# Patient Record
Sex: Female | Born: 1980
Health system: Southern US, Community
[De-identification: ages and names within clinical notes are randomized; demographics above are authoritative.]

## PROBLEM LIST (undated history)

## (undated) DIAGNOSIS — J309 Allergic rhinitis, unspecified: Secondary | ICD-10-CM

## (undated) DIAGNOSIS — R87619 Unspecified abnormal cytological findings in specimens from cervix uteri: Secondary | ICD-10-CM

## (undated) DIAGNOSIS — M25561 Pain in right knee: Secondary | ICD-10-CM

## (undated) DIAGNOSIS — A312 Disseminated mycobacterium avium-intracellulare complex (DMAC): Secondary | ICD-10-CM

## (undated) DIAGNOSIS — I10 Essential (primary) hypertension: Secondary | ICD-10-CM

## (undated) DIAGNOSIS — B20 Human immunodeficiency virus [HIV] disease: Secondary | ICD-10-CM

## (undated) DIAGNOSIS — M25562 Pain in left knee: Secondary | ICD-10-CM

## (undated) DIAGNOSIS — J302 Other seasonal allergic rhinitis: Secondary | ICD-10-CM

## (undated) HISTORY — DX: Disseminated mycobacterium avium-intracellulare complex (DMAC): A31.2

## (undated) HISTORY — DX: Morbid (severe) obesity due to excess calories: E66.01

## (undated) HISTORY — DX: Allergic rhinitis, unspecified: J30.9

## (undated) HISTORY — DX: Pain in left knee: M25.562

## (undated) HISTORY — DX: Human immunodeficiency virus (HIV) disease: B20

## (undated) HISTORY — DX: Unspecified abnormal cytological findings in specimens from cervix uteri: R87.619

## (undated) HISTORY — DX: Other seasonal allergic rhinitis: J30.2

## (undated) HISTORY — DX: Pain in right knee: M25.561

---
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
## 2009-09-01 DIAGNOSIS — A312 Disseminated mycobacterium avium-intracellulare complex (DMAC): Secondary | ICD-10-CM

## 2009-09-01 HISTORY — DX: Disseminated mycobacterium avium-intracellulare complex (DMAC): A31.2

## 2009-11-05 DIAGNOSIS — R87619 Unspecified abnormal cytological findings in specimens from cervix uteri: Secondary | ICD-10-CM

## 2009-11-05 HISTORY — DX: Unspecified abnormal cytological findings in specimens from cervix uteri: R87.619

## 2016-10-02 ENCOUNTER — Encounter: Payer: Self-pay | Admitting: Infectious Diseases

## 2016-10-02 ENCOUNTER — Other Ambulatory Visit: Payer: Self-pay | Admitting: Infectious Diseases

## 2016-10-03 ENCOUNTER — Encounter: Payer: Self-pay | Admitting: Infectious Diseases

## 2016-10-03 ENCOUNTER — Ambulatory Visit (INDEPENDENT_AMBULATORY_CARE_PROVIDER_SITE_OTHER): Payer: Self-pay | Admitting: Infectious Diseases

## 2016-10-03 VITALS — BP 140/94 | HR 96 | Temp 97.7°F | Wt >= 6400 oz

## 2016-10-03 DIAGNOSIS — B20 Human immunodeficiency virus [HIV] disease: Secondary | ICD-10-CM | POA: Insufficient documentation

## 2016-10-03 DIAGNOSIS — E669 Obesity, unspecified: Secondary | ICD-10-CM

## 2016-10-03 DIAGNOSIS — Z87898 Personal history of other specified conditions: Secondary | ICD-10-CM

## 2016-10-03 DIAGNOSIS — B370 Candidal stomatitis: Secondary | ICD-10-CM | POA: Insufficient documentation

## 2016-10-03 DIAGNOSIS — J302 Other seasonal allergic rhinitis: Secondary | ICD-10-CM

## 2016-10-03 DIAGNOSIS — Z6841 Body Mass Index (BMI) 40.0 and over, adult: Secondary | ICD-10-CM

## 2016-10-03 DIAGNOSIS — IMO0001 Reserved for inherently not codable concepts without codable children: Secondary | ICD-10-CM

## 2016-10-03 DIAGNOSIS — Z113 Encounter for screening for infections with a predominantly sexual mode of transmission: Secondary | ICD-10-CM

## 2016-10-03 DIAGNOSIS — Z8742 Personal history of other diseases of the female genital tract: Secondary | ICD-10-CM

## 2016-10-03 HISTORY — DX: Other seasonal allergic rhinitis: J30.2

## 2016-10-03 LAB — CBC WITH DIFFERENTIAL/PLATELET
Basophils Absolute: 0 cells/uL (ref 0–200)
Basophils Relative: 0 %
EOS ABS: 104 {cells}/uL (ref 15–500)
Eosinophils Relative: 4 %
HEMATOCRIT: 39.4 % (ref 35.0–45.0)
HEMOGLOBIN: 12.5 g/dL (ref 11.7–15.5)
LYMPHS ABS: 806 {cells}/uL — AB (ref 850–3900)
LYMPHS PCT: 31 %
MCH: 26.2 pg — ABNORMAL LOW (ref 27.0–33.0)
MCHC: 31.7 g/dL — AB (ref 32.0–36.0)
MCV: 82.4 fL (ref 80.0–100.0)
MONO ABS: 260 {cells}/uL (ref 200–950)
MPV: 9.3 fL (ref 7.5–12.5)
Monocytes Relative: 10 %
NEUTROS PCT: 55 %
Neutro Abs: 1430 cells/uL — ABNORMAL LOW (ref 1500–7800)
Platelets: 226 10*3/uL (ref 140–400)
RBC: 4.78 MIL/uL (ref 3.80–5.10)
RDW: 17.4 % — AB (ref 11.0–15.0)
WBC: 2.6 10*3/uL — ABNORMAL LOW (ref 3.8–10.8)

## 2016-10-03 MED ORDER — EMTRICITABINE-TENOFOVIR AF 200-25 MG PO TABS: 1.0000 | ORAL_TABLET | Freq: Every day | ORAL | 5 refills | Status: DC

## 2016-10-03 MED ORDER — FLUCONAZOLE 200 MG PO TABS: 200.0000 mg | ORAL_TABLET | Freq: Every day | ORAL | 0 refills | Status: DC

## 2016-10-03 MED ORDER — AZITHROMYCIN 600 MG PO TABS: 1200.0000 mg | ORAL_TABLET | ORAL | 0 refills | Status: DC

## 2016-10-03 MED ORDER — AZITHROMYCIN 600 MG PO TABS: 1200.0000 mg | ORAL_TABLET | ORAL | 11 refills | Status: DC

## 2016-10-03 MED ORDER — DARUNAVIR-COBICISTAT 800-150 MG PO TABS: 1.0000 | ORAL_TABLET | Freq: Every day | ORAL | 5 refills | Status: DC

## 2016-10-03 MED ORDER — DAPSONE 100 MG PO TABS: 100.0000 mg | ORAL_TABLET | Freq: Every day | ORAL | 0 refills | Status: DC

## 2016-10-03 MED ORDER — DAPSONE 100 MG PO TABS: 100.0000 mg | ORAL_TABLET | Freq: Every day | ORAL | 5 refills | Status: DC

## 2016-10-03 MED FILL — AZITHROMYCIN 600 MG TABLET: 600 | 28 days supply | Qty: 8 | Fill #0

## 2016-10-03 MED FILL — FLUCONAZOLE 200 MG TABLET: 200 | 10 days supply | Qty: 10 | Fill #0

## 2016-10-03 MED FILL — DAPSONE 100 MG TABLET: 100 | 30 days supply | Qty: 30 | Fill #0

## 2016-10-03 NOTE — Assessment & Plan Note (Signed)
Nasal congestion worsened since moving to [place]. Previously on Loratadine without remedy. Advised to try OTC Zyrtec once a day. Cautioned against Flonase with Prezcobix and advised to stick to saline rinses for nasal congestion at this point.

## 2016-10-03 NOTE — Assessment & Plan Note (Addendum)
Will start Descovy/Prezcobix today and await resistance testing as she is treatment experienced. With oral thrush and history of MAC wll rx Dapsone and Azithromycin for OI proph. She is having regular unprotected sex with her fiance Sara Osborne - counseled on HIV transmission and safe sex practices. Will check the following labs: CBC w/ Diff, CMET, HgbA1C, HIV VL with genotype reflex including INSTI resistance, CD4 count, HLA-B*701, Hep A Ab, HepB sAg, HepB sAb, HepC Ab, quantiferon gold, urinalysis, urine GC/C, PAP smear obtained today with HPV testing, cervical GC/C, trichomoniasis and pregnancy test as she has had unprotected sex and still menstruating regularly. She will return to meet with our pharmacy team in a few weeks and with myself in 6 - 8 weeks with labs the week before.   Discussed during our encounter PrEP - partner testing negative today for rapid HIV. He has a visit scheduled with pharmacy team for follow up care.

## 2016-10-03 NOTE — Progress Notes (Addendum)
HPI: Sara Osborne is a 36 y.o. female who presents to the [facility] clinic to initiate care for her HIV infection with our nurse practitioner Judeth CornfieldStephanie. She was diagnosed with HIV in 1999.   Allergies: Allergies  Allergen Reactions  . Bactrim [Sulfamethoxazole-Trimethoprim] Rash    Fever  . Sulfa Antibiotics Rash    Past Medical History: Past Medical History:  Diagnosis Date  . Abnormal Pap smear of cervix 11/05/2009   LGSIL/ASCUS with colposcopy/biopsy 12/2009  . Allergic rhinitis   . 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 Completed tx.   Marland Kitchen. HIV (human immunodeficiency virus infection) (HCC) 1999   Found during pregnancy; CD4 at time of Dx 14  . HIV (human immunodeficiency virus infection) (HCC) 10/03/2016  . Morbid obesity (HCC)    514 lb 2014  . Obesity 10/03/2016  . Seasonal allergies 10/03/2016    Social History: Social History   Social History  . Marital status: Divorced    Spouse name: N/A  . Number of children: 2  . Years of education: 15   Occupational History  . unemployed     Social History Main Topics  . Smoking status: Former Smoker    Types: Cigarettes    Quit date: 08/04/2015  . Smokeless tobacco: Never Used  . Alcohol use No  . Drug use: No  . Sexual activity: Yes    Partners: Male    Birth control/ protection: None   Other Topics Concern  . None   Social History Narrative  . None    Current Regimen: None right now  Labs: No results found for: HIV1RNAQUANT, HIV1RNAVL, CD4TABS, HEPBSAB, HEPBSAG, HCVAB  CrCl: CrCl cannot be calculated (No order found.).  Lipids: No results found for: CHOL, TRIG, HDL, CHOLHDL, VLDL, LDLCALC  Assessment: Sara Osborne is here today to initiate care in our clinic for her HIV infection.  She has a long history of HIV, diagnosed in 1999.  She was initially on Combivir + Viracept when diagnosed and then eventually transitioned to Atripla.  She has  had several lapses in therapy due to moving to different states throughout her life.  She has not been on medications for her HIV since 2014. She tells me she was pretty good about taking her medications when she had them and was engaged in care.  Due to her spotty adherence and lapses in taking medications for her HIV infection, we will start her on something with a high barrier to resistance such as Prezcobix + Descovy until we get resistance labs back.  She has no insurance and applied for ADAP with Olegario MessierKathy.  It will be a few weeks until she is approved.  I will call her/reach out to her once she gets approved and bring her back in ~1 month later for adherence and labs. I also spoke to her fiance, Dushun, about PrEP.  They routinely have unprotected sex.   Plans: - Prezcobix + Descovy once ADAP approved - Labs today per Judeth CornfieldStephanie - Will schedule follow-up once she starts medications  Cassie L. Kuppelweiser, PharmD, CPP Infectious Diseases Clinical Pharmacist [facility] 10/03/2016, 3:55 PM    atripla combivr + viracept AZT  -- dx in 1999  ADAP.

## 2016-10-03 NOTE — Assessment & Plan Note (Signed)
Encouraged good food choices. Asked to attempt to decrease sugar/processed foods from diet and start incorporating small changes for exercise at home. Limited by joint pain/back pain. BP slightly above goal today - will reassess and follow. May need BP medication if she cannot lose the weight. Hgb A1C obtained today. Will check fasting lipids with next lab draw.

## 2016-10-03 NOTE — Patient Instructions (Addendum)
We are going to start you on DESCOVY + PREZCOBIX today - these are very important to take at the same time a day every day TOGETHER with food.   We will check lab work today and let you know of results. Instructions sent for MyChart so you can see your lab results.   For your stuffy nose - can try Zyrtec - D or Claritin - D over the counter daily to help if allergy related. Also continue to use your saline rinse.   Tylenol for ear pain  Back to see pharmacy team in 2-3 weeks and with Judeth CornfieldStephanie again in 8 weeks to recheck your labs.

## 2016-10-03 NOTE — Progress Notes (Signed)
Brief Narrative: Sara Osborne is a new patient to our clinic for HIV care. She recently relocated from [place] with [facility]. Originally diagnosed with HIV in 1999 during pregnancy. History of OIs: disseminated MAC  PCP - Patient, No Pcp Per    Patient Active Problem List   Diagnosis Date Noted  . HIV (human immunodeficiency virus infection) ([place]) 10/03/2016  . Thrush 10/03/2016  . Obesity 10/03/2016  . Seasonal allergies 10/03/2016    Patient's Medications  New Prescriptions   AZITHROMYCIN (ZITHROMAX) 600 MG TABLET    Take 2 tablets (1,200 mg total) by mouth once a week.   DAPSONE 100 MG TABLET    Take 1 tablet (100 mg total) by mouth daily.   DARUNAVIR-COBICISTAT (PREZCOBIX) 800-150 MG TABLET    Take 1 tablet by mouth daily. Swallow whole. Do NOT crush, break or chew tablets. Take with food.   EMTRICITABINE-TENOFOVIR AF (DESCOVY) 200-25 MG TABLET    Take 1 tablet by mouth daily.   FLUCONAZOLE (DIFLUCAN) 200 MG TABLET    Take 1 tablet (200 mg total) by mouth daily.  Previous Medications   No medications on file  Modified Medications   No medications on file  Discontinued Medications   No medications on file    Subjective: Sara Osborne presents to clinic today for her HIV infection.   HPI:  HIV =  She was originally diagnosed with HIV in 1999 during her first pregnancy. In the past she was maintained on Combivir/Viracept in early 2000s and was doing well with tx adherence until 2005 when she moved to [place] and was lost to care until 2011 when she returned to care in [place]. At that time she was found to have disseminated MAC 09/2009 on AFB BCx - she was asymptomatic at the time. Was treated with ethambutol 1600 mg daily + Azithromycin 600 mg daily + Rifabutin 450 mg daily x 12 months until 08/31/2010 when she was transitioned to MAC proph with weekly azithromycin + daily ethambutol. Was taking Atripla since 12/2009 until 2014. She has not been seen for  her HIV in ~3 years.    Since that time she Symptoms associated with her HIV today include recurrent thrush and genital ulcers. Last viral load and CD4 counts 07/2011 - CD4 315 (14 at initial diagnosis), VL - 41   Abnormal Pap Smears = I have seen both LGSIL and ASCUS documented in her chart from 2011 samples - reports having had a biopsy in 12/2009. Last pap done 2011.   Health Maintenance = ongoing struggle with weight gain. She has gained 40 lbs since 2014 documentation.    I have reviewed all available clinical documents sent from [facility] from 2013 - 2014.   Review of Systems: Review of Systems  Constitutional: Negative for chills, fever, malaise/fatigue and weight loss.  HENT: Positive for congestion and ear pain. Negative for ear discharge, hearing loss, sinus pain and sore throat.   Eyes: Negative for blurred vision.  Respiratory: Positive for wheezing. Negative for cough, sputum production and shortness of breath.   Cardiovascular: Negative for chest pain and orthopnea.  Gastrointestinal: Negative for abdominal pain, diarrhea, nausea and vomiting.  Genitourinary: Negative for dysuria, frequency and urgency.  Musculoskeletal: Positive for back pain and joint pain. Negative for myalgias.  Skin: Negative for rash.  Neurological: Negative for dizziness and headaches.  Endo/Heme/Allergies: Negative for polydipsia.  Psychiatric/Behavioral: Negative for depression and substance abuse. The patient is not  nervous/anxious.     Past Medical History:  Diagnosis Date  . Abnormal Pap smear of cervix 11/05/2009   LGSIL/ASCUS with colposcopy/biopsy 12/2009  . Allergic rhinitis   . Disseminated mycobacterium avium-intracellulare complex ([place]) 09/2009   Dx via BCx - asymptomatic at the time. Completed tx.   Marland Kitchen HIV (human immunodeficiency virus infection) ([place]) 1999   Found during pregnancy; CD4 at time of Dx 14  . HIV (human immunodeficiency virus infection) ([place]) 10/03/2016  .  Morbid obesity ([place])    514 lb 2014  . Obesity 10/03/2016  . Seasonal allergies 10/03/2016    Social History  Substance Use Topics  . Smoking status: Former Smoker    Types: Cigarettes    Quit date: 08/04/2015  . Smokeless tobacco: Never Used  . Alcohol use No    Family History  Problem Relation Age of Onset  . Hypertension Mother   . Hypertension Father   . Heart attack Father     Allergies  Allergen Reactions  . Bactrim [Sulfamethoxazole-Trimethoprim] Rash    Fever  . Sulfa Antibiotics Rash    Objective:  Vitals:   10/03/16 0921  BP: (!) 140/94  Pulse: 96  Temp: 97.7 F (36.5 C)  TempSrc: Oral  Weight: (!) 539 lb (244.5 kg)   There is no height or weight on file to calculate BMI.  Physical Exam  Constitutional: She is oriented to person, place, and time and well-developed, well-nourished, and in no distress.  HENT:  Right Ear: Tympanic membrane, external ear and ear canal normal.  Left Ear: External ear and ear canal normal. A middle ear effusion is present.  Mouth/Throat: No oral lesions. Abnormal dentition. Dental caries present. No dental abscesses.  Oral thrush present   Cardiovascular: Normal rate, regular rhythm and normal heart sounds.   Pulmonary/Chest: Effort normal and breath sounds normal.  Abdominal: Soft. She exhibits no distension. There is no tenderness.  Genitourinary: Cervix normal. Vulva exhibits no erythema, no lesion and no rash. Thin  odorless  white and vaginal discharge found.  Musculoskeletal: Normal range of motion. She exhibits no tenderness.  Lymphadenopathy:    She has no cervical adenopathy.    She has no axillary adenopathy.  Neurological: She is alert and oriented to person, place, and time.  Skin: Skin is warm and dry. No rash noted.  Psychiatric: Mood, affect and judgment normal.    Lab Results  Reviewed from clinical docs 08/2010 - CD4 - 123 VL - 110 copies  G6PD - normal  HAV - immune HBV - immune   Problem List Items  Addressed This Visit      Respiratory   Seasonal allergies    Nasal congestion worsened since moving to [place]. Previously on Loratadine without remedy. Advised to try OTC Zyrtec once a day. Cautioned against Flonase with Prezcobix and advised to stick to saline rinses for nasal congestion at this point.         Digestive   Thrush   Relevant Medications   emtricitabine-tenofovir AF (DESCOVY) 200-25 MG tablet   darunavir-cobicistat (PREZCOBIX) 800-150 MG tablet   azithromycin (ZITHROMAX) 600 MG tablet   dapsone 100 MG tablet   fluconazole (DIFLUCAN) 200 MG tablet     Other   Obesity    Encouraged good food choices. Asked to attempt to decrease sugar/processed foods from diet and start incorporating small changes for exercise at home. Limited by joint pain/back pain. BP slightly above goal today - will reassess and follow. May need BP medication  if she cannot lose the weight. Hgb A1C obtained today. Will check fasting lipids with next lab draw.       Relevant Orders   Lipid panel   HIV (human immunodeficiency virus infection) ([place]) (Chronic)    Will start Descovy/Prezcobix today and await resistance testing as she is treatment experienced. With oral thrush and history of MAC wll rx Dapsone and Azithromycin for OI proph. She is having regular unprotected sex with her fiance Raquel Sarna - counseled on HIV transmission and safe sex practices. Will check the following labs: CBC w/ Diff, CMET, HgbA1C, HIV VL with genotype reflex including INSTI resistance, CD4 count, HLA-B*701, Hep A Ab, HepB sAg, HepB sAb, HepC Ab, quantiferon gold, urinalysis, urine GC/C, PAP smear obtained today with HPV testing, cervical GC/C, trichomoniasis and pregnancy test as she has had unprotected sex and still menstruating regularly. She will return to meet with our pharmacy team in a few weeks and with myself in 6 - 8 weeks with labs the week before.   Discussed during our encounter PrEP - partner testing negative today  for rapid HIV. He has a visit scheduled with pharmacy team for follow up care.        Relevant Medications   emtricitabine-tenofovir AF (DESCOVY) 200-25 MG tablet   darunavir-cobicistat (PREZCOBIX) 800-150 MG tablet   azithromycin (ZITHROMAX) 600 MG tablet   dapsone 100 MG tablet   fluconazole (DIFLUCAN) 200 MG tablet   Other Relevant Orders   HIV RNA, RTPCR W/R GT (RTI, PI,INT)   T-helper cell (CD4)- ([facility] clinic only)   RPR   COMPLETE METABOLIC PANEL WITH GFR   CBC with Differential/Platelet   HLA B*5701   Quantiferon tb gold assay (blood)   Hemoglobin A1c   hCG, quantitative, pregnancy   Hepatitis B surface antigen   Hepatitis B surface antibody   Hepatitis A antibody, total   Urinalysis   Urine cytology ancillary only   HIV 1 RNA quant-no reflex-bld   Lipid panel    Other Visit Diagnoses    History of abnormal cervical Pap smear    -  Primary   Relevant Orders   Cytology - PAP   History of AIDS (HCC)       Relevant Medications   azithromycin (ZITHROMAX) 600 MG tablet   dapsone 100 MG tablet   Screening examination for venereal disease       Relevant Orders   Urine cytology ancillary only     Health maintenance = PAP smear obtained today in office. Will await results and refer for GYN services if indicated.   Smoking cessation = recently quit smoking within the last 12 months. Celebrated and offered continued support through counseling services here should they be needed.   I spent greater than 60 minutes with the patient including greater than 50% of time in face to face counsel of the patient re HIV care, HIV prevention / PrEP for her partner, pap smear procedure and in coordination of their care.  Janene Madeira, MSN, NP-C [facility] [place] Cell: [phone] Pager: [phone]  10/03/16 12:10 PM

## 2016-10-04 LAB — HEPATITIS B SURFACE ANTIGEN: Hepatitis B Surface Ag: NEGATIVE

## 2016-10-04 LAB — HCG, QUANTITATIVE, PREGNANCY: hCG, Beta Chain, Quant, S: <2 m[IU]/mL

## 2016-10-04 LAB — URINALYSIS
BILIRUBIN URINE: NEGATIVE
GLUCOSE, UA: NEGATIVE
Hgb urine dipstick: NEGATIVE
Ketones, ur: NEGATIVE
LEUKOCYTES UA: NEGATIVE
Nitrite: NEGATIVE
SPECIFIC GRAVITY, URINE: 1.027 (ref 1.001–1.035)
pH: 6 (ref 5.0–8.0)

## 2016-10-04 LAB — HEPATITIS B SURFACE ANTIBODY,QUALITATIVE: HEP B S AB: POSITIVE — AB

## 2016-10-04 LAB — COMPLETE METABOLIC PANEL WITH GFR
ALT: 17 U/L (ref 6–29)
AST: 24 U/L (ref 10–30)
Albumin: 3.8 g/dL (ref 3.6–5.1)
Alkaline Phosphatase: 95 U/L (ref 33–115)
BUN: 13 mg/dL (ref 7–25)
CHLORIDE: 104 mmol/L (ref 98–110)
CO2: 22 mmol/L (ref 20–31)
CREATININE: 0.69 mg/dL (ref 0.50–1.10)
Calcium: 8.4 mg/dL — ABNORMAL LOW (ref 8.6–10.2)
GFR, Est African American: >89 mL/min (ref >=60)
GFR, Est Non African American: >89 mL/min (ref >=60)
GLUCOSE: 99 mg/dL (ref 65–99)
Potassium: 4.1 mmol/L (ref 3.5–5.3)
SODIUM: 139 mmol/L (ref 135–146)
Total Bilirubin: 0.3 mg/dL (ref 0.2–1.2)
Total Protein: 7.7 g/dL (ref 6.1–8.1)

## 2016-10-04 LAB — HEMOGLOBIN A1C
HEMOGLOBIN A1C: 5.8 % — AB (ref <5.7)
MEAN PLASMA GLUCOSE: 120 mg/dL

## 2016-10-04 LAB — HEPATITIS A ANTIBODY, TOTAL: Hep A Total Ab: NONREACTIVE

## 2016-10-05 ENCOUNTER — Telehealth: Payer: Self-pay | Admitting: Infectious Diseases

## 2016-10-05 DIAGNOSIS — B20 Human immunodeficiency virus [HIV] disease: Secondary | ICD-10-CM

## 2016-10-05 LAB — T-HELPER CELL (CD4) - (RCID CLINIC ONLY): CD4 % Helper T Cell: <1 % — ABNORMAL LOW (ref 33–55)

## 2016-10-05 LAB — CYTOLOGY - PAP
ADEQUACY: ABSENT
Chlamydia: NEGATIVE
Diagnosis: NEGATIVE
HPV (WINDOPATH): NOT DETECTED
NEISSERIA GONORRHEA: NEGATIVE
Trichomonas: NEGATIVE

## 2016-10-05 LAB — QUANTIFERON TB GOLD ASSAY (BLOOD)
Interferon Gamma Release Assay: NEGATIVE
Mitogen-Nil: 6.43 IU/mL
QUANTIFERON TB AG MINUS NIL: 0 [IU]/mL
Quantiferon Nil Value: 0.17 IU/mL

## 2016-10-05 LAB — URINE CYTOLOGY ANCILLARY ONLY
Chlamydia: NEGATIVE
NEISSERIA GONORRHEA: NEGATIVE

## 2016-10-05 NOTE — Telephone Encounter (Addendum)
Called to discuss CD4 results with Sara Osborne - again verified she has no fevers/chills, weight loss, diarrhea/abdominal pain, headaches, vision changes, or lymphadenopathy. Reports her ADAP was just approved and she will get her Descovy/Prezcobix from [place] soon. She has picked up her Azithromycin, Dapsone and Fluconazole and started taking these medications.   Will have her come Monday AM for lab work to check AFB blood cx since she has had asymptomatic disseminated MAC in the past after several years of being off ART.

## 2016-10-05 NOTE — Progress Notes (Signed)
Patient CD4 count extremely low - I have already placed her on OI proph at her recent OV with dapsone / azithromycin with her history and presence of oral thrush.

## 2016-10-05 NOTE — Progress Notes (Signed)
Cervical cytology normal without HPV detected, although no EC/TZ cells present as collection was difficult due to body habitus. Per ASCCP and HIV guidelines will continue with routine annual screening, however will consider re-testing in 6 months considering her history and uncontrolled HIV.

## 2016-10-06 ENCOUNTER — Encounter: Payer: Self-pay | Admitting: Infectious Diseases

## 2016-10-06 LAB — RPR

## 2016-10-06 NOTE — Telephone Encounter (Signed)
Lab appt made for 10/10/16 in the AM, no transportation in PM.

## 2016-10-07 LAB — HLA B*5701: HLA-B*5701 w/rflx HLA-B High: NEGATIVE

## 2016-10-10 ENCOUNTER — Other Ambulatory Visit: Payer: Self-pay

## 2016-10-10 LAB — HIV RNA, RTPCR W/R GT (RTI, PI,INT)
HIV-1 RNA, QN PCR: 5.42 {Log_copies}/mL — AB
HIV-1 RNA, QN PCR: 264000 copies/mL — ABNORMAL HIGH

## 2016-10-12 ENCOUNTER — Other Ambulatory Visit: Payer: Self-pay

## 2016-10-16 ENCOUNTER — Encounter: Payer: Self-pay | Admitting: Infectious Diseases

## 2016-10-16 ENCOUNTER — Telehealth: Payer: Self-pay | Admitting: *Deleted

## 2016-10-16 DIAGNOSIS — Z9119 Patient's noncompliance with other medical treatment and regimen: Secondary | ICD-10-CM

## 2016-10-16 DIAGNOSIS — Z91199 Patient's noncompliance with other medical treatment and regimen due to unspecified reason: Secondary | ICD-10-CM

## 2016-10-16 LAB — RFLX HIV-1 INTEGRASE GENOTYPE

## 2016-10-16 NOTE — Telephone Encounter (Signed)
I have sent her a MyChart message as well to schedule an appointment. Please send in referral for Ambre to assist with her care. She needs OV with me or pharmacy in 2 weeks.   Thank you

## 2016-10-16 NOTE — Telephone Encounter (Signed)
[facility] to see if patient was in care, asked for records. Sent as requested. Per chart, [facility]. Andree CossHowell, Bushra Denman M, RN

## 2016-10-16 NOTE — Addendum Note (Signed)
Addended by: Andree CossHOWELL, MICHELLE M on: 10/16/2016 04:17 PM   Modules accepted: Orders

## 2016-10-16 NOTE — Telephone Encounter (Signed)
Order written. Thanks! 

## 2016-10-17 LAB — HIV-1 GENOTYPE: HIV-1 Genotype: DETECTED — AB

## 2016-10-20 ENCOUNTER — Telehealth: Payer: Self-pay | Admitting: *Deleted

## 2016-10-20 NOTE — Telephone Encounter (Signed)
Referral received and I noted from prior documentation that the patient is on vacation so email was sent stating  "Sara EchevariaHey Sara Osborne!! This is Sara Osborne. I was not sure if you were back from [place] and just wanted to say Hello! Looking forward to meeting you.  Just give me a call, text or email with you get back and try to stay out of the HEAT.  Lind CovertAmbre Tyreisha Ungar [phone] [name]"

## 2016-10-23 ENCOUNTER — Other Ambulatory Visit: Payer: Self-pay

## 2016-10-23 DIAGNOSIS — B20 Human immunodeficiency virus [HIV] disease: Secondary | ICD-10-CM

## 2016-10-23 DIAGNOSIS — IMO0001 Reserved for inherently not codable concepts without codable children: Secondary | ICD-10-CM

## 2016-10-24 ENCOUNTER — Telehealth: Payer: Self-pay | Admitting: *Deleted

## 2016-10-24 LAB — LIPID PANEL
CHOLESTEROL: 180 mg/dL (ref <200)
HDL: 56 mg/dL (ref >50)
LDL Cholesterol: 111 mg/dL — ABNORMAL HIGH (ref <100)
TRIGLYCERIDES: 66 mg/dL (ref <150)
Total CHOL/HDL Ratio: 3.2 Ratio (ref <5.0)
VLDL: 13 mg/dL (ref <30)

## 2016-10-24 NOTE — Telephone Encounter (Signed)
Called patient today RN explained my role and offered services at this time. 1st contact made via email on the 20th. She has picked up all of her medications and states getting the medications from [facility] is not a problem. At this time she is not ready for a home visit. RN offered just calls, text messages for support without home visits but patient is unable to consent to care at this time. I did not want to be too aggressive. Visit with Sara CornfieldStephanie scheduled for August 6th and RN will just meet the patient at that time.

## 2016-10-25 LAB — HIV-1 RNA QUANT-NO REFLEX-BLD
HIV 1 RNA QUANT: 3680 {copies}/mL — AB
HIV-1 RNA Quant, Log: 3.57 Log copies/mL — ABNORMAL HIGH

## 2016-10-26 ENCOUNTER — Encounter: Payer: Self-pay | Admitting: Infectious Diseases

## 2016-11-03 ENCOUNTER — Encounter: Payer: Self-pay | Admitting: Licensed Clinical Social Worker

## 2016-11-06 ENCOUNTER — Ambulatory Visit
Admission: RE | Admit: 2016-11-06 | Discharge: 2016-11-06 | Disposition: A | Discharge status: Home / Self Care | Payer: Self-pay | Source: Ambulatory Visit | Attending: Infectious Diseases | Admitting: Infectious Diseases

## 2016-11-06 ENCOUNTER — Other Ambulatory Visit: Payer: Self-pay | Admitting: Infectious Diseases

## 2016-11-06 ENCOUNTER — Ambulatory Visit: Payer: Self-pay | Admitting: *Deleted

## 2016-11-06 ENCOUNTER — Ambulatory Visit (INDEPENDENT_AMBULATORY_CARE_PROVIDER_SITE_OTHER): Payer: Self-pay | Admitting: Infectious Diseases

## 2016-11-06 ENCOUNTER — Encounter: Payer: Self-pay | Admitting: Infectious Diseases

## 2016-11-06 VITALS — BP 129/85 | Temp 97.7°F | Wt >= 6400 oz

## 2016-11-06 DIAGNOSIS — M25561 Pain in right knee: Secondary | ICD-10-CM

## 2016-11-06 DIAGNOSIS — M25562 Pain in left knee: Secondary | ICD-10-CM

## 2016-11-06 DIAGNOSIS — B20 Human immunodeficiency virus [HIV] disease: Secondary | ICD-10-CM

## 2016-11-06 DIAGNOSIS — Z21 Asymptomatic human immunodeficiency virus [HIV] infection status: Secondary | ICD-10-CM

## 2016-11-06 DIAGNOSIS — Z0000 Encounter for general adult medical examination without abnormal findings: Secondary | ICD-10-CM

## 2016-11-06 DIAGNOSIS — H539 Unspecified visual disturbance: Secondary | ICD-10-CM

## 2016-11-06 DIAGNOSIS — Z113 Encounter for screening for infections with a predominantly sexual mode of transmission: Secondary | ICD-10-CM | POA: Insufficient documentation

## 2016-11-06 HISTORY — DX: Pain in left knee: M25.562

## 2016-11-06 HISTORY — DX: Pain in right knee: M25.561

## 2016-11-06 NOTE — Assessment & Plan Note (Addendum)
I have referred her to internal medicine for primary care management. She is in need of opthalmology referral because her visual acuity has changed, however has no insurance. Discussed [facility] role in helping obtain glasses. Will also provide resources to obtain [employer] as she may have some orthopedic knee pending this xray of her knee. No vaccine needs today. Needs to lose weight to help overall CV risk reduction, diabetes risk (she is pre-diabetic by A1C, which may be under-estimated with her HIV) and now knee pain.

## 2016-11-06 NOTE — Patient Instructions (Addendum)
We will refer you to primary care for [facility] Card to help get your eyes evaluated.   Continue to take your Prezcobix and Descovy with food every day. We will check your labs today.   ICE ICE ICE for your knees in addition to over the counter tylenol and ibuprofen. Please try to take tylenol as first option.     Plantar Fasciitis Plantar fasciitis is a painful foot condition that affects the heel. It occurs when the band of tissue that connects the toes to the heel bone (plantar fascia) becomes irritated. This can happen after exercising too much or doing other repetitive activities (overuse injury). The pain from plantar fasciitis can range from mild irritation to severe pain that makes it difficult for you to walk or move. The pain is usually worse in the morning or after you have been sitting or lying down for a while. What are the causes? This condition may be caused by:  Standing for long periods of time.  Wearing shoes that do not fit.  Doing high-impact activities, including running, aerobics, and ballet.  Being overweight.  Having an abnormal way of walking (gait).  Having tight calf muscles.  Having high arches in your feet.  Starting a new athletic activity.  What are the signs or symptoms? The main symptom of this condition is heel pain. Other symptoms include:  Pain that gets worse after activity or exercise.  Pain that is worse in the morning or after resting.  Pain that goes away after you walk for a few minutes.  How is this diagnosed? This condition may be diagnosed based on your signs and symptoms. Your health care provider will also do a physical exam to check for:  A tender area on the bottom of your foot.  A high arch in your foot.  Pain when you move your foot.  Difficulty moving your foot.  You may also need to have imaging studies to confirm the diagnosis. These can include:  X-rays.  Ultrasound.  MRI.  How is this treated? Treatment for  plantar fasciitis depends on the severity of the condition. Your treatment may include:  Rest, ice, and over-the-counter pain medicines to manage your pain.  Exercises to stretch your calves and your plantar fascia.  A splint that holds your foot in a stretched, upward position while you sleep (night splint).  Physical therapy to relieve symptoms and prevent problems in the future.  Cortisone injections to relieve severe pain.  Extracorporeal shock wave therapy (ESWT) to stimulate damaged plantar fascia with electrical impulses. It is often used as a last resort before surgery.  Surgery, if other treatments have not worked after 12 months.  Follow these instructions at home:  Take medicines only as directed by your health care provider.  Avoid activities that cause pain.  Roll the bottom of your foot over a bag of ice or a bottle of cold water. Do this for 20 minutes, 3-4 times a day.  Perform simple stretches as directed by your health care provider.  Try wearing athletic shoes with air-sole or gel-sole cushions or soft shoe inserts.  Wear a night splint while sleeping, if directed by your health care provider.  Keep all follow-up appointments with your health care provider. How is this prevented?  Do not perform exercises or activities that cause heel pain.  Consider finding low-impact activities if you continue to have problems.  Lose weight if you need to. The best way to prevent plantar fasciitis is to  avoid the activities that aggravate your plantar fascia. Contact a health care provider if:  Your symptoms do not go away after treatment with home care measures.  Your pain gets worse.  Your pain affects your ability to move or do your daily activities. This information is not intended to replace advice given to you by your health care provider. Make sure you discuss any questions you have with your health care provider. Document Released: 12/13/2000 Document  Revised: 08/23/2015 Document Reviewed: 01/28/2014 Elsevier Interactive Patient Education  [employer]2018 Elsevier Inc.

## 2016-11-06 NOTE — Assessment & Plan Note (Addendum)
Doing well on DESCOVY / PREZCOBIX aside from what I think are headaches associated with medication use. No resistance detected aside from K103n NNRTI that was likely transmitted. I hope they will resolve with some time however I advised her to continue to track. Oral thrush resolved on mediations. Continue dapsone and azithromycin with history of disseminated MAC and very low CD4 count. VL and CD4 to be checked again today. She has had excellent viral reduction sofar. She will return to see me again in 2 months. Her husband is being seen in our clinic for PrEP.

## 2016-11-06 NOTE — Progress Notes (Signed)
PCP - Mower Callas, NP    Patient Active Problem List   Diagnosis Date Noted  . Knee pain, bilateral 11/06/2016  . Healthcare maintenance 11/06/2016  . HIV (human immunodeficiency virus infection) ([place]) 10/03/2016  . Obesity 10/03/2016  . Seasonal allergies 10/03/2016    Patient's Medications  New Prescriptions   No medications on file  Previous Medications   AZITHROMYCIN (ZITHROMAX) 600 MG TABLET    Take 2 tablets (1,200 mg total) by mouth once a week.   DAPSONE 100 MG TABLET    Take 1 tablet (100 mg total) by mouth daily.   DARUNAVIR-COBICISTAT (PREZCOBIX) 800-150 MG TABLET    Take 1 tablet by mouth daily. Swallow whole. Do NOT crush, break or chew tablets. Take with food.   EMTRICITABINE-TENOFOVIR AF (DESCOVY) 200-25 MG TABLET    Take 1 tablet by mouth daily.   FLUCONAZOLE (DIFLUCAN) 200 MG TABLET    Take 1 tablet (200 mg total) by mouth daily.  Modified Medications   No medications on file  Discontinued Medications   No medications on file    Subjective: Nysia Dell presents to clinic today for routine follow up care for her HIV infection.  HPI:  HIV =  Currently on a regimen of Descovy / Prezcobix and is tolerating it well. Over the last 30 days she has missed no doses and is taking it with food. No concerns regarding medications or HIV disease. No associated symptoms of her HIV today and reports her oral thrush has resolved with Fluconazole and ART use.    Headaches = started since resuming medications. Comes and goes and is experiencing it 2-3 times a week. Occasionally relieved by OTC analgesics.   Knee and Foot/Heel Pain, Bilateral = reports this has been worsening recently especially after some car trips that required her to sit several hours at a time. Describes the pain to be all over front knee and constant ache with grinding effect now. Has taken OTC ibuprofen and tylenol for the pain and has only helped reduce pain to 4/10. Feet have a tremendous  amount of tired / pressure to them over heel area that she only notices at the end of the day. Not present in the morning. She feels this is partly due to resuming activities. Prior to moving here she describes herself to be "mostly sedentary in one spot."    Review of Systems: Review of Systems  Constitutional: Negative for chills, fever, malaise/fatigue and weight loss.  HENT: Negative for sore throat.   Respiratory: Negative for cough, sputum production and shortness of breath.   Cardiovascular: Negative.  Negative for leg swelling.  Gastrointestinal: Positive for constipation. Negative for abdominal pain, diarrhea and vomiting.  Genitourinary: Negative for dysuria and urgency.  Musculoskeletal: Positive for joint pain (knee and feet bilaterally ). Negative for myalgias and neck pain.  Skin: Negative for rash.  Neurological: Positive for headaches.  Psychiatric/Behavioral: Negative for depression and substance abuse. The patient is not nervous/anxious.     Past Medical History:  Diagnosis Date  . Abnormal Pap smear of cervix 11/05/2009   LGSIL/ASCUS with colposcopy/biopsy 12/2009  . Allergic rhinitis   . 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 Completed tx.   Marland Kitchen HIV (human immunodeficiency virus infection) ([place]) 1999   Found during pregnancy; CD4 at time of Dx 14  . HIV (human immunodeficiency virus infection) ([place]) 10/03/2016  . Knee pain, bilateral 11/06/2016  . Morbid obesity (  [place])    514 lb 2014  . Obesity 10/03/2016  . Seasonal allergies 10/03/2016    Social History  Substance Use Topics  . Smoking status: Former Smoker    Types: Cigarettes    Quit date: 08/04/2015  . Smokeless tobacco: Never Used  . Alcohol use No    Family History  Problem Relation Age of Onset  . Hypertension Mother   . Hypertension Father   . Heart attack Father     Allergies  Allergen Reactions  . Bactrim  [Sulfamethoxazole-Trimethoprim] Rash    Fever  . Sulfa Antibiotics Rash    Objective:  Vitals:   11/06/16 1019  BP: 129/85  Temp: 97.7 F (36.5 C)  TempSrc: Oral  Weight: (!) 551 lb (249.9 kg)   There is no height or weight on file to calculate BMI.  Physical Exam  Constitutional: She is oriented to person, place, and time and well-developed, well-nourished, and in no distress. Vital signs are normal.  Obese female seated in chair today. Ambulates without aid. Accompanied by her fiance.   HENT:  Mouth/Throat: No oral lesions. No dental abscesses. No oropharyngeal exudate.  Cardiovascular: Normal rate, regular rhythm and normal heart sounds.   Pulmonary/Chest: Effort normal and breath sounds normal.  Abdominal: Soft. She exhibits no distension. There is no tenderness.  Musculoskeletal: Normal range of motion.       Right knee: She exhibits normal range of motion, no swelling, no effusion, no deformity, no erythema, normal alignment, no LCL laxity and no MCL laxity. Tenderness found.       Left knee: She exhibits normal range of motion, no swelling, no deformity, no erythema, no LCL laxity and no MCL laxity. Tenderness found.  Difficult to adequately assess ligament laxity d/t body habitus.  Lymphadenopathy:    She has no cervical adenopathy.  Neurological: She is alert and oriented to person, place, and time.  Skin: Skin is warm and dry. No rash noted.  Psychiatric: Mood, affect and judgment normal.    Lab Results Lab Results  Component Value Date   WBC 2.6 (L) 10/03/2016   HGB 12.5 10/03/2016   HCT 39.4 10/03/2016   MCV 82.4 10/03/2016   PLT 226 10/03/2016    Lab Results  Component Value Date   CREATININE 0.69 10/03/2016   BUN 13 10/03/2016   NA 139 10/03/2016   K 4.1 10/03/2016   CL 104 10/03/2016   CO2 22 10/03/2016    Lab Results  Component Value Date   ALT 17 10/03/2016   AST 24 10/03/2016   ALKPHOS 95 10/03/2016   BILITOT 0.3 10/03/2016    Lab Results   Component Value Date   CHOL 180 10/23/2016   HDL 56 10/23/2016   LDLCALC 111 (H) 10/23/2016   TRIG 66 10/23/2016   CHOLHDL 3.2 10/23/2016   HIV 1 RNA Quant (copies/mL)  Date Value  10/23/2016 3,680 (H)   CD4 T Cell Abs (/uL)  Date Value  10/03/2016 <10 (L)   Lab Results  Component Value Date   HAV NON REACTIVE 10/03/2016   Lab Results  Component Value Date   HEPBSAG NEGATIVE 10/03/2016   HEPBSAB POS (A) 10/03/2016   No results found for: HCVAB Lab Results  Component Value Date   CHLAMYDIAWP Negative 10/03/2016   CHLAMYDIAWP Negative 10/03/2016   N Negative 10/03/2016   N Negative 10/03/2016   No results found for: GCPROBEAPT Lab Results  Component Value Date   QUANTGOLD NEGATIVE 10/03/2016   No results  found for: RPR    Problem List Items Addressed This Visit      Other   Knee pain, bilateral - Primary    Exam only reveals tenderness with palpation. Limited with body habitus but no seeming laxity, effusion or meniscal tear. Will obtain xray today. I feel that a large majority of this has to do with her weight causing pressure on her joints and recently getting back moving around more often. I advised her to continue with OTC tylenol with sparing Ibuprofen and Ice her knees every night to help with inflammation.       HIV (human immunodeficiency virus infection) ([place]) (Chronic)    Doing well on DESCOVY / PREZCOBIX aside from what I think are headaches associated with medication use. No resistance detected aside from K103n NNRTI that was likely transmitted. I hope they will resolve with some time however I advised her to continue to track. Oral thrush resolved on mediations. Continue dapsone and azithromycin with history of disseminated MAC and very low CD4 count. VL and CD4 to be checked again today. She has had excellent viral reduction sofar. She will return to see me again in 2 months. Her husband is being seen in our clinic for PrEP.       Relevant Orders    HIV 1 RNA quant-no reflex-bld   T-helper cell (CD4)- ([facility] clinic only)   Healthcare maintenance    I have referred her to internal medicine for primary care management. She is in need of opthalmology referral because her visual acuity has changed, however has no insurance. Discussed [facility]P role in helping obtain glasses. Will also provide resources to obtain [employer] as she may have some orthopedic knee pending this xray of her knee. No vaccine needs today. Needs to lose weight to help overall CV risk reduction, diabetes risk (she is pre-diabetic by A1C, which may be under-estimated with her HIV) and now knee pain.        Other Visit Diagnoses    Vision changes       Relevant Orders   Ambulatory referral to Ophthalmology   Health care maintenance       Relevant Orders   Ambulatory referral to Internal Medicine      Janene Madeira, MSN, NP-C [facility] 11/06/16 1:20 PM

## 2016-11-06 NOTE — Assessment & Plan Note (Signed)
Exam only reveals tenderness with palpation. Limited with body habitus but no seeming laxity, effusion or meniscal tear. Will obtain xray today. I feel that a large majority of this has to do with her weight causing pressure on her joints and recently getting back moving around more often. I advised her to continue with OTC tylenol with sparing Ibuprofen and Ice her knees every night to help with inflammation.

## 2016-11-07 ENCOUNTER — Encounter: Payer: Self-pay | Admitting: Infectious Diseases

## 2016-11-07 LAB — T-HELPER CELL (CD4) - (RCID CLINIC ONLY)
CD4 % Helper T Cell: 8 % — ABNORMAL LOW (ref 33–55)
CD4 T CELL ABS: 220 /uL — AB (ref 400–2700)

## 2016-11-08 LAB — HIV-1 RNA QUANT-NO REFLEX-BLD
HIV 1 RNA Quant: 1300 copies/mL — ABNORMAL HIGH
HIV-1 RNA QUANT, LOG: 3.11 {Log_copies}/mL — AB

## 2016-11-21 NOTE — Progress Notes (Signed)
Sara Osborne came in for her appt with Judeth Cornfield NP after meeting with the patient it does not appear that she will need my assistance. I will close this referral at this time

## 2016-12-06 LAB — AFB CULTURE, BLOOD

## 2017-01-15 ENCOUNTER — Ambulatory Visit (INDEPENDENT_AMBULATORY_CARE_PROVIDER_SITE_OTHER): Payer: Self-pay | Admitting: Infectious Diseases

## 2017-01-15 ENCOUNTER — Encounter: Payer: Self-pay | Admitting: Infectious Diseases

## 2017-01-15 VITALS — BP 133/92 | HR 96 | Temp 98.4°F | Ht 63.0 in | Wt >= 6400 oz

## 2017-01-15 DIAGNOSIS — M25561 Pain in right knee: Secondary | ICD-10-CM

## 2017-01-15 DIAGNOSIS — Z23 Encounter for immunization: Secondary | ICD-10-CM

## 2017-01-15 DIAGNOSIS — M25562 Pain in left knee: Secondary | ICD-10-CM

## 2017-01-15 DIAGNOSIS — Z6841 Body Mass Index (BMI) 40.0 and over, adult: Secondary | ICD-10-CM

## 2017-01-15 DIAGNOSIS — R079 Chest pain, unspecified: Secondary | ICD-10-CM

## 2017-01-15 DIAGNOSIS — Z0000 Encounter for general adult medical examination without abnormal findings: Secondary | ICD-10-CM

## 2017-01-15 DIAGNOSIS — B20 Human immunodeficiency virus [HIV] disease: Secondary | ICD-10-CM

## 2017-01-15 NOTE — Assessment & Plan Note (Signed)
Believe that this is d/t emotional stress and not cardiac in cause. Will check magnesium and CMP today. Not currently having CP. We discussed having a low threshold to go to ER for evaluation. She does not have many risk factors for CV disease however being HIV+ and morbidly obese would have low threshold to work this up and exclude cardiac nature.

## 2017-01-15 NOTE — Progress Notes (Signed)
PCP - [place] Callas, NP    Patient Active Problem List   Diagnosis Date Noted  . Chest pain 01/15/2017  . Knee pain, bilateral 11/06/2016  . Healthcare maintenance 11/06/2016  . HIV (human immunodeficiency virus infection) ([place]) 10/03/2016  . Obesity 10/03/2016  . Seasonal allergies 10/03/2016    Patient's Medications  New Prescriptions   No medications on file  Previous Medications   AZITHROMYCIN (ZITHROMAX) 600 MG TABLET    Take 2 tablets (1,200 mg total) by mouth once a week.   DAPSONE 100 MG TABLET    Take 1 tablet (100 mg total) by mouth daily.   DARUNAVIR-COBICISTAT (PREZCOBIX) 800-150 MG TABLET    Take 1 tablet by mouth daily. Swallow whole. Do NOT crush, break or chew tablets. Take with food.   EMTRICITABINE-TENOFOVIR AF (DESCOVY) 200-25 MG TABLET    Take 1 tablet by mouth daily.   FLUCONAZOLE (DIFLUCAN) 200 MG TABLET    Take 1 tablet (200 mg total) by mouth daily.  Modified Medications   No medications on file  Discontinued Medications   No medications on file    Subjective: Sara Osborne presents to clinic today for routine follow up care for her HIV infection.  HPI:  HIV =  Currently on a regimen of Descovy / Prezcobix and is tolerating it well. Over the last 30 days she has missed no doses and is taking it with food. Endorses no complaints today suggestive of associated opportunistic infection or advancing HIV disease such as fevers, night sweats, weight loss, anorexia, cough, SOB, nausea, vomiting, diarrhea, headache, sensory changes, lymphadenopathy or oral thrush. She is also taking dapsone daily and azithromycin weekly (breaks up tablets to help with nausea).   Obesity =  Has lost about 40 lbs in the last 2 months. Increased energy and doing well eating more vegetables and balanced diet. Difficulty exercising with knee pain.   Chest Pain this past weekend that was over left chest and around shoulder. Had the 1 year anniversary of her niece's murder.  Took 2 aspirin and pain resolved. No associated nausea, jaw pain, diaphoresis, SOB or dyspnea.   Knee and Foot/Heel Pain, Bilateral = improved on tylenol but still present. Now with catching and difficulty bending knees. Hopeful that this will improve with continued weight loss.   Healthcare Maintenance = continues with headaches a few times a week. Nothing severe or unresolved with OTC tylenol. Has not applied for [employer] yet. 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  Review of Systems: Review of Systems  Constitutional: Negative for chills, fever, malaise/fatigue and weight loss.  HENT: Negative for sore throat.   Respiratory: Negative for cough, sputum production and shortness of breath.   Cardiovascular: Positive for chest pain and leg swelling.  Gastrointestinal: Negative for abdominal pain, diarrhea and vomiting.  Genitourinary: Negative for dysuria and urgency.  Musculoskeletal: Positive for joint pain (knee and feet bilaterally ). Negative for myalgias and neck pain.  Skin: Negative for rash.  Neurological: Positive for headaches.  Psychiatric/Behavioral: Negative for depression and substance abuse. The patient is not nervous/anxious.     Past Medical History:  Diagnosis Date  . Abnormal Pap smear of cervix 11/05/2009   LGSIL/ASCUS with colposcopy/biopsy 12/2009  . Allergic rhinitis   . 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 Completed tx.   Marland Kitchen HIV (human immunodeficiency virus infection) ([place]) 1999   Found during pregnancy; CD4  at time of Dx 14  . HIV (human immunodeficiency virus infection) ([place]) 10/03/2016  . Knee pain, bilateral 11/06/2016  . Morbid obesity ([place])    514 lb 2014  . Obesity 10/03/2016  . Seasonal allergies 10/03/2016    Social History  Substance Use Topics  . Smoking status: Former Smoker    Types: Cigarettes    Quit date: 08/04/2015  . Smokeless  tobacco: Never Used  . Alcohol use No    Family History  Problem Relation Age of Onset  . Hypertension Mother   . Hypertension Father   . Heart attack Father     Allergies  Allergen Reactions  . Bactrim [Sulfamethoxazole-Trimethoprim] Rash    Fever  . Sulfa Antibiotics Rash    Objective:  Vitals:   01/15/17 0927  BP: (!) 133/92  Pulse: 96  Temp: 98.4 F (36.9 C)  TempSrc: Oral  Weight: (!) 497 lb (225.4 kg)  Height: 5' 3"  (1.6 m)   Body mass index is 88.04 kg/m.  Physical Exam  Constitutional: She is oriented to person, place, and time and well-developed, well-nourished, and in no distress. Vital signs are normal.  Obese young female accompanied by her fiance today. No walking aid but ambulates with some difficulty.   HENT:  Mouth/Throat: Oropharynx is clear and moist. No oral lesions. No dental abscesses.  Cardiovascular: Normal rate, regular rhythm, normal heart sounds and intact distal pulses.  Exam reveals no friction rub.   No murmur heard. Pulmonary/Chest: Effort normal and breath sounds normal.  Abdominal: Soft. She exhibits no distension. There is no tenderness.  Musculoskeletal: Normal range of motion.       Right knee: She exhibits normal range of motion, no swelling, no effusion, no deformity, no erythema, normal alignment, no LCL laxity and no MCL laxity. Tenderness found.       Left knee: She exhibits normal range of motion, no swelling, no deformity, no erythema, no LCL laxity and no MCL laxity. Tenderness found.  Difficult to adequately assess ligament laxity d/t body habitus.  Lymphadenopathy:    She has no cervical adenopathy.  Neurological: She is alert and oriented to person, place, and time.  Skin: Skin is warm and dry. No rash noted.  Psychiatric: Mood, affect and judgment normal.    Lab Results Lab Results  Component Value Date   WBC 2.6 (L) 10/03/2016   HGB 12.5 10/03/2016   HCT 39.4 10/03/2016   MCV 82.4 10/03/2016   PLT 226  10/03/2016    Lab Results  Component Value Date   CREATININE 0.87 01/15/2017   BUN 18 01/15/2017   NA 138 01/15/2017   K 4.6 01/15/2017   CL 103 01/15/2017   CO2 29 01/15/2017    Lab Results  Component Value Date   ALT 7 01/15/2017   AST 11 01/15/2017   ALKPHOS 95 10/03/2016   BILITOT 0.4 01/15/2017    Lab Results  Component Value Date   CHOL 180 10/23/2016   HDL 56 10/23/2016   LDLCALC 111 (H) 10/23/2016   TRIG 66 10/23/2016   CHOLHDL 3.2 10/23/2016   HIV 1 RNA Quant (copies/mL)  Date Value  11/06/2016 1,300 (H)  10/23/2016 3,680 (H)   CD4 T Cell Abs (/uL)  Date Value  11/06/2016 220 (L)  10/03/2016 <10 (L)   Lab Results  Component Value Date   HAV NON REACTIVE 10/03/2016   Lab Results  Component Value Date   HEPBSAG NEGATIVE 10/03/2016   HEPBSAB POS (A) 10/03/2016  No results found for: HCVAB Lab Results  Component Value Date   CHLAMYDIAWP Negative 10/03/2016   CHLAMYDIAWP Negative 10/03/2016   N Negative 10/03/2016   N Negative 10/03/2016   No results found for: GCPROBEAPT Lab Results  Component Value Date   QUANTGOLD NEGATIVE 10/03/2016   No results found for: RPR    Problem List Items Addressed This Visit      Other   Chest pain    Believe that this is d/t emotional stress and not cardiac in cause. Will check magnesium and CMP today. Not currently having CP. We discussed having a low threshold to go to ER for evaluation. She does not have many risk factors for CV disease however being HIV+ and morbidly obese would have low threshold to work this up and exclude cardiac nature.       Relevant Orders   Magnesium (Completed)   Healthcare maintenance    Flu and pneumovax shots today - counseling provided.       HIV (human immunodeficiency virus infection) ([place]) - Primary (Chronic)    Continue Descovy, Prezcobix - will check VL/CD4 count today. Continue prophylactic dapsone and azithromycin - with h/o disseminated MAC disease will continue  azithromycin for 3 months past adequate CD4. Back in 2 months pending labs. Will discuss stopping OI proph once we get current CD4 count back.       Relevant Orders   HIV 1 RNA quant-no reflex-bld   T-helper cell (CD4)- ([facility] clinic only)   COMPLETE METABOLIC PANEL WITH GFR (Completed)   Pneumococcal polysaccharide vaccine 23-valent greater than or equal to 2yo subcutaneous/IM (Completed)   Knee pain, bilateral    Likely needs knee replacement with current xray results indicating severe OA. Needs to complete orange card application; may have insurance option through new job she is hopeful to get. Informed her to update clinic and we can make referral to orthopedics but she needs to continue to lose weight to even undergo surgery safely. Continue tylenol and ice.       Obesity    Doing well losing weight and hopefully she will continue this.        Other Visit Diagnoses    Need for pneumococcal vaccine       Relevant Orders   Pneumococcal polysaccharide vaccine 23-valent greater than or equal to 2yo subcutaneous/IM (Completed)   Need for immunization against influenza       Relevant Orders   Flu Vaccine QUAD 36+ mos IM (Completed)      Janene Madeira, MSN, NP-C [facility] 01/15/17 5:40 PM

## 2017-01-15 NOTE — Assessment & Plan Note (Signed)
Continue Descovy, Prezcobix - will check VL/CD4 count today. Continue prophylactic dapsone and azithromycin - with h/o disseminated MAC disease will continue azithromycin for 3 months past adequate CD4. Back in 2 months pending labs. Will discuss stopping OI proph once we get current CD4 count back.

## 2017-01-15 NOTE — Patient Instructions (Signed)
Great job on the weight loss! Very proud of you.   Continue taking your Descovy and Prezcobix every day.   Will continue with the azithromycin once a week (OK to space out the tablets like we discussed to help with side effects). Continue the Dapsone once a day for now. If these labs today look good we can talk about stopping them.   Back in 2 months pending lab work.

## 2017-01-15 NOTE — Assessment & Plan Note (Signed)
Doing well losing weight and hopefully she will continue this.

## 2017-01-15 NOTE — Assessment & Plan Note (Addendum)
Flu and pneumovax shots today - counseling provided.

## 2017-01-15 NOTE — Assessment & Plan Note (Signed)
Likely needs knee replacement with current xray results indicating severe OA. Needs to complete orange card application; may have insurance option through new job she is hopeful to get. Informed her to update clinic and we can make referral to orthopedics but she needs to continue to lose weight to even undergo surgery safely. Continue tylenol and ice.

## 2017-01-16 LAB — COMPLETE METABOLIC PANEL WITH GFR
AG Ratio: 0.9 (calc) — ABNORMAL LOW (ref 1.0–2.5)
ALBUMIN MSPROF: 3.8 g/dL (ref 3.6–5.1)
ALKALINE PHOSPHATASE (APISO): 87 U/L (ref 33–115)
ALT: 7 U/L (ref 6–29)
AST: 11 U/L (ref 10–30)
BILIRUBIN TOTAL: 0.4 mg/dL (ref 0.2–1.2)
BUN: 18 mg/dL (ref 7–25)
CHLORIDE: 103 mmol/L (ref 98–110)
CO2: 29 mmol/L (ref 20–32)
Calcium: 8.9 mg/dL (ref 8.6–10.2)
Creat: 0.87 mg/dL (ref 0.50–1.10)
GFR, EST NON AFRICAN AMERICAN: 85 mL/min/{1.73_m2} (ref >=60)
GFR, Est African American: 98 mL/min/{1.73_m2} (ref >=60)
GLOBULIN: 4.2 g/dL — AB (ref 1.9–3.7)
Glucose, Bld: 99 mg/dL (ref 65–99)
POTASSIUM: 4.6 mmol/L (ref 3.5–5.3)
SODIUM: 138 mmol/L (ref 135–146)
Total Protein: 8 g/dL (ref 6.1–8.1)

## 2017-01-16 LAB — HIV-1 RNA QUANT-NO REFLEX-BLD

## 2017-01-16 LAB — T-HELPER CELL (CD4) - (RCID CLINIC ONLY)
CD4 T CELL HELPER: 8 % — AB (ref 33–55)
CD4 T Cell Abs: 120 /uL — ABNORMAL LOW (ref 400–2700)

## 2017-01-16 LAB — MAGNESIUM: Magnesium: 2.1 mg/dL (ref 1.5–2.5)

## 2017-01-17 ENCOUNTER — Other Ambulatory Visit: Payer: Self-pay | Admitting: Infectious Diseases

## 2017-01-17 DIAGNOSIS — B20 Human immunodeficiency virus [HIV] disease: Secondary | ICD-10-CM

## 2017-01-17 LAB — HIV-1 RNA QUANT-NO REFLEX-BLD
HIV 1 RNA Quant: 119 copies/mL — ABNORMAL HIGH
HIV-1 RNA Quant, Log: 2.08 Log copies/mL — ABNORMAL HIGH

## 2017-01-17 MED ORDER — AZITHROMYCIN 600 MG PO TABS: 1200.0000 mg | ORAL_TABLET | ORAL | 2 refills | Status: DC

## 2017-01-17 MED ORDER — DAPSONE 100 MG PO TABS: 100.0000 mg | ORAL_TABLET | Freq: Every day | ORAL | 2 refills | Status: DC

## 2017-01-30 ENCOUNTER — Other Ambulatory Visit: Payer: Self-pay | Admitting: Pharmacist

## 2017-03-10 ENCOUNTER — Encounter (HOSPITAL_COMMUNITY): Payer: Self-pay | Admitting: Emergency Medicine

## 2017-03-10 ENCOUNTER — Other Ambulatory Visit: Payer: Self-pay

## 2017-03-10 ENCOUNTER — Emergency Department (HOSPITAL_COMMUNITY): Payer: Self-pay

## 2017-03-10 ENCOUNTER — Emergency Department (HOSPITAL_COMMUNITY)
Admission: EM | Admit: 2017-03-10 | Discharge: 2017-03-11 | Disposition: A | Discharge status: Home / Self Care | Payer: Self-pay | Attending: Emergency Medicine | Admitting: Emergency Medicine

## 2017-03-10 DIAGNOSIS — Z79899 Other long term (current) drug therapy: Secondary | ICD-10-CM | POA: Insufficient documentation

## 2017-03-10 DIAGNOSIS — G4489 Other headache syndrome: Secondary | ICD-10-CM | POA: Insufficient documentation

## 2017-03-10 DIAGNOSIS — Z87891 Personal history of nicotine dependence: Secondary | ICD-10-CM | POA: Insufficient documentation

## 2017-03-10 LAB — CBC WITH DIFFERENTIAL/PLATELET
Basophils Absolute: 0 10*3/uL (ref 0.0–0.1)
Basophils Relative: 1 %
EOS ABS: 0.1 10*3/uL (ref 0.0–0.7)
EOS PCT: 2 %
HCT: 40 % (ref 36.0–46.0)
Hemoglobin: 12.7 g/dL (ref 12.0–15.0)
LYMPHS ABS: 2 10*3/uL (ref 0.7–4.0)
LYMPHS PCT: 44 %
MCH: 30.1 pg (ref 26.0–34.0)
MCHC: 31.8 g/dL (ref 30.0–36.0)
MCV: 94.8 fL (ref 78.0–100.0)
MONOS PCT: 11 %
Monocytes Absolute: 0.5 10*3/uL (ref 0.1–1.0)
Neutro Abs: 1.8 10*3/uL (ref 1.7–7.7)
Neutrophils Relative %: 42 %
PLATELETS: 281 10*3/uL (ref 150–400)
RBC: 4.22 MIL/uL (ref 3.87–5.11)
RDW: 15.1 % (ref 11.5–15.5)
WBC: 4.4 10*3/uL (ref 4.0–10.5)

## 2017-03-10 LAB — I-STAT CHEM 8, ED
BUN: 17 mg/dL (ref 6–20)
CREATININE: 0.8 mg/dL (ref 0.44–1.00)
Calcium, Ion: 1.15 mmol/L (ref 1.15–1.40)
Chloride: 100 mmol/L — ABNORMAL LOW (ref 101–111)
GLUCOSE: 128 mg/dL — AB (ref 65–99)
HEMATOCRIT: 43 % (ref 36.0–46.0)
Hemoglobin: 14.6 g/dL (ref 12.0–15.0)
POTASSIUM: 4.3 mmol/L (ref 3.5–5.1)
Sodium: 141 mmol/L (ref 135–145)
TCO2: 29 mmol/L (ref 22–32)

## 2017-03-10 LAB — BASIC METABOLIC PANEL
Anion gap: 7 (ref 5–15)
BUN: 15 mg/dL (ref 6–20)
CHLORIDE: 101 mmol/L (ref 101–111)
CO2: 28 mmol/L (ref 22–32)
CREATININE: 0.85 mg/dL (ref 0.44–1.00)
Calcium: 9 mg/dL (ref 8.9–10.3)
GFR calc Af Amer: >60 mL/min (ref >60)
GFR calc non Af Amer: >60 mL/min (ref >60)
GLUCOSE: 133 mg/dL — AB (ref 65–99)
POTASSIUM: 4 mmol/L (ref 3.5–5.1)
SODIUM: 136 mmol/L (ref 135–145)

## 2017-03-10 LAB — I-STAT BETA HCG BLOOD, ED (MC, WL, AP ONLY): I-stat hCG, quantitative: <5 m[IU]/mL (ref <5)

## 2017-03-10 NOTE — ED Triage Notes (Signed)
Pt c/o 7/10 Headache since 11 am today, pt states she is been taking otc medicine for cold with no relief, she was having some numbness on her right side, now she is 2/10 pain no more numbness present. Pt is AO x 4 no neuro deficit noticed.

## 2017-03-11 MED ORDER — ASPIRIN 81 MG PO CHEW: 81.0000 mg | CHEWABLE_TABLET | Freq: Every day | ORAL | 0 refills | Status: DC

## 2017-03-11 NOTE — ED Provider Notes (Addendum)
[facility] EMERGENCY DEPARTMENT Provider Note   CSN: [id number] Arrival date & time: 03/10/17  2229     History   Chief Complaint Chief Complaint  Patient presents with  . Headache    HPI Sara Osborne is a 36 y.o. female.  HPI  This is a 36 year old female with a history of HIV and morbid obesity who presents with headache.  Patient reports onset of headache at 11 AM.  She took an Alka-Seltzer secondary to recent upper respiratory symptoms.  She states that the headache is frontal mostly over the right frontal region.  Currently her pain is 1 out of 10.  It is worse with palpation of the head.  She denies any fevers or neck stiffness.  She states that at 8 PM she noticed some tingling in the right side of her face right arm and right leg.  That has since resolved.  She denies any vision changes or peripheral vision loss.  She is compliant with her HIV medications.  Patient reports a history of headaches but no formal diagnosis of migraines.  She states "I usually have symptoms of migraines."  Past Medical History:  Diagnosis Date  . Abnormal Pap smear of cervix 11/05/2009   LGSIL/ASCUS with colposcopy/biopsy 12/2009  . Allergic rhinitis   . 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 Completed tx.   Marland Kitchen HIV (human immunodeficiency virus infection) (HCC) 1999   Found during pregnancy; CD4 at time of Dx 14  . HIV (human immunodeficiency virus infection) (HCC) 10/03/2016  . Knee pain, bilateral 11/06/2016  . Morbid obesity (HCC)    514 lb 2014  . Obesity 10/03/2016  . Seasonal allergies 10/03/2016    Patient Active Problem List   Diagnosis Date Noted  . Chest pain 01/15/2017  . Knee pain, bilateral 11/06/2016  . Healthcare maintenance 11/06/2016  . HIV (human immunodeficiency virus infection) (HCC) 10/03/2016  . Obesity 10/03/2016  . Seasonal allergies 10/03/2016    Past Surgical History:    Procedure Laterality Date  . CESAREAN SECTION  2003    OB History    No data available       Home Medications    Prior to Admission medications   Medication Sig Start Date End Date Taking? Authorizing Provider  aspirin 81 MG chewable tablet Chew 1 tablet (81 mg total) by mouth daily. 03/11/17   Alivea Gladson, Mayer Masker, MD  azithromycin (ZITHROMAX) 600 MG tablet Take 2 tablets (1,200 mg total) by mouth once a week. 01/17/17   Blanchard Kelch, NP  dapsone 100 MG tablet Take 1 tablet (100 mg total) by mouth daily. 01/17/17   Blanchard Kelch, NP  darunavir-cobicistat (PREZCOBIX) 800-150 MG tablet Take 1 tablet by mouth daily. Swallow whole. Do NOT crush, break or chew tablets. Take with food. 10/03/16   Blanchard Kelch, NP  emtricitabine-tenofovir AF (DESCOVY) 200-25 MG tablet Take 1 tablet by mouth daily. 10/03/16   Blanchard Kelch, NP  fluconazole (DIFLUCAN) 200 MG tablet Take 1 tablet (200 mg total) by mouth daily. Patient not taking: Reported on 11/06/2016 10/03/16   Kuppelweiser, Cherylann Ratel, RPH-CPP    Family History Family History  Problem Relation Age of Onset  . Hypertension Mother   . Hypertension Father   . Heart attack Father     Social History Social History   Tobacco Use  . Smoking status: Former Smoker    Types: Cigarettes    Last  attempt to quit: 08/04/2015    Years since quitting: 1.6  . Smokeless tobacco: Never Used  Substance Use Topics  . Alcohol use: No  . Drug use: No     Allergies   Bactrim [sulfamethoxazole-trimethoprim] and Sulfa antibiotics   Review of Systems Review of Systems  Constitutional: Negative for fever.  HENT: Positive for congestion.   Eyes: Negative for visual disturbance.  Respiratory: Negative for shortness of breath.   Cardiovascular: Negative for chest pain.  Gastrointestinal: Negative for abdominal pain, nausea and vomiting.  Neurological: Positive for headaches. Negative for dizziness, weakness and numbness.       Tingling   All other systems reviewed and are negative.    Physical Exam Updated Vital Signs BP (!) 145/87 (BP Location: Right Arm)   Pulse 95   Temp 97.8 F (36.6 C) (Oral)   Resp 18   Ht 5\' 4"  (1.626 m)   Wt (!) 249.5 kg (550 lb)   LMP 03/01/2017   SpO2 100%   BMI 94.41 kg/m   Physical Exam  Constitutional: She is oriented to person, place, and time.  Morbidly obese, no acute distress  HENT:  Head: Normocephalic and atraumatic.  Eyes: Pupils are equal, round, and reactive to light.  Pupils 3 mm and reactive bilaterally  Neck: Normal range of motion. Neck supple.  No meningismus  Cardiovascular: Normal rate, regular rhythm and normal heart sounds.  Pulmonary/Chest: Effort normal. No respiratory distress. She has no wheezes.  Abdominal: Soft. There is no tenderness.  Neurological: She is alert and oriented to person, place, and time.  Cranial nerves II through XII intact, 5 out of 5 strength in all 4 extremities, no dysmetria to finger-nose-finger  Skin: Skin is warm and dry.  Psychiatric: She has a normal mood and affect.  Nursing note and vitals reviewed.    ED Treatments / Results  Labs (all labs ordered are listed, but only abnormal results are displayed) Labs Reviewed  BASIC METABOLIC PANEL - Abnormal; Notable for the following components:      Result Value   Glucose, Bld 133 (*)    All other components within normal limits  I-STAT CHEM 8, ED - Abnormal; Notable for the following components:   Chloride 100 (*)    Glucose, Bld 128 (*)    All other components within normal limits  CBC WITH DIFFERENTIAL/PLATELET  I-STAT BETA HCG BLOOD, ED ([facility], [facility], [facility] ONLY)  I-STAT BETA HCG BLOOD, ED ([facility], [facility], [facility] ONLY)    EKG  EKG Interpretation None       Radiology Ct Head Wo Contrast  Result Date: 03/10/2017 CLINICAL DATA:  Headache beginning this morning. Cold symptoms. Resolved RIGHT-sided numbness. EXAM: CT HEAD WITHOUT CONTRAST TECHNIQUE: Contiguous axial images were  obtained from the base of the skull through the vertex without intravenous contrast. COMPARISON:  None. FINDINGS: BRAIN: No intraparenchymal hemorrhage, mass effect nor midline shift. The ventricles and sulci are normal. No acute large vascular territory infarcts. No abnormal extra-axial fluid collections. Basal cisterns are patent. VASCULAR: Unremarkable. SKULL/SOFT TISSUES: No skull fracture. No significant soft tissue swelling. Partially empty sella. ORBITS/SINUSES: The included ocular globes and orbital contents are normal. Pneumatized petrous apices. 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 OTHER: None. IMPRESSION: 1. No acute intracranial process. 2. Partially empty sella, otherwise negative noncontrast CT HEAD. Electronically Signed   By: Awilda Metroourtnay  Bloomer M.D.   On: 03/10/2017 23:32    Procedures Procedures (  including critical care time)  Medications Ordered in ED Medications - No data to display   Initial Impression / Assessment and Plan / ED Course  I have reviewed the triage vital signs and the nursing notes.  Pertinent labs & imaging results that were available during my care of the patient were reviewed by me and considered in my medical decision making (see chart for details).    Patient presents with headache.  Symptoms have mostly resolved.  Residual headache 1 out of 10.  Neurologic exam is benign.  Initial blood pressure was 188/86.  On my repeat evaluation it is 147/86.  Suspect this is likely a complicated migraine versus related to upper respiratory symptoms.  She did have a CT scan sent from triage given tingling on the right side.  The CT scan is negative.  She has a history of HIV but denies any infectious symptoms or fevers.  No mass lesions on CT.  While this was a noncontrasted study, feel this is adequate given no additional symptoms and otherwise reassuring workup and clinical exam.   Low suspicion for meningitis or encephalitis.  I discussed with the patient that I feel she is low risk for stroke.  Additionally, given patient's body habitus, she will not fit in MRI scanner for further MRI.  Recommend daily aspirin and neurology follow-up.  Patient declines any medications for residual headache at this time.  She was given strict return precautions including focal neurologic deficits, fevers, neck stiffness, or any new or worsening symptoms.  After history, exam, and medical workup I feel the patient has been appropriately medically screened and is safe for discharge home. Pertinent diagnoses were discussed with the patient. Patient was given return precautions.    Final Clinical Impressions(s) / ED Diagnoses   Final diagnoses:  Other headache syndrome    ED Discharge Orders        Ordered    aspirin 81 MG chewable tablet  Daily     03/11/17 0018       Aerionna Moravek, Mayer Maskerourtney F, MD 03/11/17 [id number]    Shon BatonHorton, Tyria Springer F, MD 03/11/17 [phone]

## 2017-03-11 NOTE — Discharge Instructions (Signed)
You were seen today for a headache.  Your symptoms mostly resolved.  Your CT scan is negative.  Your exam is reassuring.  This is unlikely a TIA and more likely a complicated migraine.  However, start aspirin daily and follow-up with neurology.  If you develop any new strokelike symptoms or focal weakness, numbness or vision changes you need to be reevaluated immediately.

## 2017-03-14 ENCOUNTER — Other Ambulatory Visit: Payer: Self-pay | Admitting: Infectious Diseases

## 2017-03-14 DIAGNOSIS — B20 Human immunodeficiency virus [HIV] disease: Secondary | ICD-10-CM

## 2017-03-20 ENCOUNTER — Ambulatory Visit (INDEPENDENT_AMBULATORY_CARE_PROVIDER_SITE_OTHER): Payer: Self-pay | Admitting: Infectious Diseases

## 2017-03-20 ENCOUNTER — Encounter: Payer: Self-pay | Admitting: Infectious Diseases

## 2017-03-20 VITALS — BP 158/81 | HR 101 | Temp 97.6°F | Ht 64.0 in | Wt >= 6400 oz

## 2017-03-20 DIAGNOSIS — R51 Headache: Secondary | ICD-10-CM

## 2017-03-20 DIAGNOSIS — I10 Essential (primary) hypertension: Secondary | ICD-10-CM | POA: Insufficient documentation

## 2017-03-20 DIAGNOSIS — J069 Acute upper respiratory infection, unspecified: Secondary | ICD-10-CM

## 2017-03-20 DIAGNOSIS — B20 Human immunodeficiency virus [HIV] disease: Secondary | ICD-10-CM

## 2017-03-20 DIAGNOSIS — J302 Other seasonal allergic rhinitis: Secondary | ICD-10-CM

## 2017-03-20 DIAGNOSIS — R519 Headache, unspecified: Secondary | ICD-10-CM | POA: Insufficient documentation

## 2017-03-20 DIAGNOSIS — R030 Elevated blood-pressure reading, without diagnosis of hypertension: Secondary | ICD-10-CM

## 2017-03-20 MED ORDER — HYDROCOD POLST-CPM POLST ER 10-8 MG/5ML PO SUER: 5.0000 mL | Freq: Every evening | ORAL | 0 refills | Status: AC | PRN

## 2017-03-20 NOTE — Patient Instructions (Addendum)
Pay attention to the ingredients in your medicines to avoid over-dosing yourself on common things like tylenol, aspirin.   You seem to have a chest cold - would continue with supportive care. Rest, take in lots of fluids, continue with symptom management with over the counter medicines.   Honey for your cough at night will quiet it.   Benadryl to help you sleep at night (25 - 50 mg). This should help with the secretions too.   Humidifier to help with congestion.   Please come back in 2 months for follow up.

## 2017-03-20 NOTE — Assessment & Plan Note (Signed)
Instructed to add daily loratadine or cetirizine to help control allergic component. Benadryl at night to help with PND and sleeping.

## 2017-03-20 NOTE — Assessment & Plan Note (Signed)
Advised she may need to consider BP reducing agent as her last few BPs were elevated. She has been taking cough/cold medications so could be d/t pseudoephedrine / neosynephrine today. Will continue to monitor.

## 2017-03-20 NOTE — Assessment & Plan Note (Signed)
Continue Dapsone, Prezcobix/Descovy. Will go ahead and check VL/CD4 today so she does not need to come back as soon. Follow up in 2-3 months (she prefers q8031m visits for comfort). Reminded him about ADAP enrollment starting January 2019.

## 2017-03-20 NOTE — Progress Notes (Signed)
Sara Osborne  [birth year] [id number] PCP: [facility], NP   Reason for Visit: Routine HIV care  Brief Narrative: Sara Osborne is a 36 y.o. AA female with HIV infection. Originally dx in 1999 during her first pregnancy. Previous regimens: Combivir/Viracept early 2000s. Atripla from 2011 - 2014. Out of care from 2005 - 2011. OIs: MAC (+blood culture, asymptomatic) and tx with ethambutol/azithro/rifabutin x 12 months. HIV Risk: heterosexual unprotected sex Genotype: 10/2016 K103N, INSTI sensitive   Patient Active Problem List   Diagnosis Date Noted  . Frequent headaches 03/20/2017  . Transient elevated blood pressure 03/20/2017  . Viral URI 03/20/2017  . Chest pain 01/15/2017  . Knee pain, bilateral 11/06/2016  . Healthcare maintenance 11/06/2016  . HIV (human immunodeficiency virus infection) ([place]) 10/03/2016  . Obesity 10/03/2016  . Seasonal allergies 10/03/2016    HPI:  HIV = Continues on Prezcobix / Descovy and Dapsone. Has not missed any doses from her account. Taking all her medications in the evening with dinner. She and her fiance are sexually active and he is on PrEP. Interval visit to ED to be seen for uncharacteristic headache and some associated numbness. She was concerned she was having a stroke. Head CT was normal. This has not happened again since.   Cough = Has been using TheraFlu Nightime, AlkaSeltzer Plus. Has had cough now x 1 week. Associated symptoms drainage, insomnia (due to coughing). She does not have any shortness of breath. Some nasal congestion and drainage which is irritating her throat but is tolerable with lozenges. Denies any fevers or chills. Her fiance "got her sick" and is still suffering these same symptoms   Knee Pain = better since taking Advil and icing intervally. Also using Capsaicin cream topically which has offered the most help. Has her "good days and bad days" but is doing better.   Review of Systems  Constitutional: Negative for  chills, fever, malaise/fatigue and weight loss.  HENT: Positive for congestion and sore throat. Negative for ear pain.   Respiratory: Positive for cough. Negative for sputum production and shortness of breath.   Cardiovascular: Negative for chest pain and leg swelling.  Gastrointestinal: Negative for abdominal pain, diarrhea and vomiting.  Genitourinary: Negative for dysuria and urgency.  Musculoskeletal: Positive for joint pain (knee and feet bilaterally ). Negative for myalgias and neck pain.  Skin: Negative for rash.  Neurological: Positive for headaches.  Psychiatric/Behavioral: Negative for depression and substance abuse. The patient is not nervous/anxious.    Past Medical History:  Diagnosis Date  . Abnormal Pap smear of cervix 11/05/2009   LGSIL/ASCUS with colposcopy/biopsy 12/2009  . Allergic rhinitis   . Disseminated mycobacterium avium-intracellulare complex ([place]) 09/2009   Dx via BCx - asymptomatic at the time. Completed tx.   Marland Kitchen HIV (human immunodeficiency virus infection) ([place]) 1999   Found during pregnancy; CD4 at time of Dx 14  . HIV (human immunodeficiency virus infection) ([place]) 10/03/2016  . Knee pain, bilateral 11/06/2016  . Morbid obesity ([place])    514 lb 2014  . Obesity 10/03/2016  . Seasonal allergies 10/03/2016   Outpatient Medications Prior to Visit  Medication Sig Dispense Refill  . azithromycin (ZITHROMAX) 600 MG tablet Take 2 tablets (1,200 mg total) by mouth once a week. 8 tablet 2  . dapsone 100 MG tablet Take 1 tablet (100 mg total) by mouth daily. 30 tablet 2  . DESCOVY 200-25 MG tablet TAKE 1 TABLET BY MOUTH DAILY 30 tablet 2  . PREZCOBIX 800-150 MG tablet TAKE  1 TABLET BY MOUTH DAILY. SWALLOW WHOLE. DO NOT CRUSH, BREAK, OR CHEW TABLETS. TAKE WITH FOOD. 30 tablet 2  . aspirin 81 MG chewable tablet Chew 1 tablet (81 mg total) by mouth daily. (Patient not taking: Reported on 03/20/2017) 30 tablet 0  . fluconazole (DIFLUCAN) 200 MG tablet Take 1 tablet (200 mg  total) by mouth daily. (Patient not taking: Reported on 11/06/2016) 10 tablet 0   No facility-administered medications prior to visit.    Allergies  Allergen Reactions  . Bactrim [Sulfamethoxazole-Trimethoprim] Rash    Fever  . Sulfa Antibiotics Rash   Social History   Tobacco Use  . Smoking status: Former Smoker    Types: Cigarettes    Last attempt to quit: 08/04/2015    Years since quitting: 1.6  . Smokeless tobacco: Never Used  Substance Use Topics  . Alcohol use: No  . Drug use: No   Objective: Vitals:   03/20/17 0930  BP: (!) 158/81  Pulse: (!) 101  Temp: 97.6 F (36.4 C)  TempSrc: Oral  Weight: (!) 561 lb (254.5 kg)  Height: _0  (1.626 m)   Body mass index is 96.3 kg/m.  Physical Exam  Constitutional: She is oriented to person, place, and time and well-developed, well-nourished, and in no distress.  Obese pleasant female accompanied by her fiance today.   HENT:  Mouth/Throat: Oropharynx is clear and moist. No oral lesions. No dental abscesses.  Eyes: EOM are normal. Pupils are equal, round, and reactive to light. Right eye exhibits chemosis. Left eye exhibits chemosis. No scleral icterus.  Cardiovascular: Normal rate, regular rhythm and normal heart sounds.  Pulmonary/Chest: Effort normal and breath sounds normal. No respiratory distress. She has no wheezes. She has no rales.  Abdominal: Soft. She exhibits no distension. There is no tenderness.  Musculoskeletal: She exhibits no tenderness.  Lymphadenopathy:    She has no cervical adenopathy.  Neurological: She is alert and oriented to person, place, and time. No cranial nerve deficit.  Skin: Skin is warm and dry. No rash noted.  Psychiatric: Mood, affect and judgment normal.   Lab Results Lab Results  Component Value Date   WBC 4.4 03/10/2017   HGB 14.6 03/10/2017   HCT 43.0 03/10/2017   MCV 94.8 03/10/2017   PLT 281 03/10/2017    Lab Results  Component Value Date   CREATININE 0.80 03/10/2017   BUN  17 03/10/2017   NA 141 03/10/2017   K 4.3 03/10/2017   CL 100 (L) 03/10/2017   CO2 28 03/10/2017    Lab Results  Component Value Date   ALT 7 01/15/2017   AST 11 01/15/2017   ALKPHOS 95 10/03/2016   BILITOT 0.4 01/15/2017    Lab Results  Component Value Date   CHOL 180 10/23/2016   HDL 56 10/23/2016   LDLCALC 111 (H) 10/23/2016   TRIG 66 10/23/2016   CHOLHDL 3.2 10/23/2016   HIV 1 RNA Quant  Date Value  01/15/2017 CANCELED  01/15/2017 119 copies/mL (H)  11/06/2016 1,300 copies/mL (H)   CD4 T Cell Abs (/uL)  Date Value  01/15/2017 120 (L)  11/06/2016 220 (L)  10/03/2016 <10 (L)   Lab Results  Component Value Date   HAV NON REACTIVE 10/03/2016   Lab Results  Component Value Date   HEPBSAG NEGATIVE 10/03/2016   HEPBSAB POS (A) 10/03/2016   No results found for: HCVAB Lab Results  Component Value Date   CHLAMYDIAWP Negative 10/03/2016   CHLAMYDIAWP Negative 10/03/2016  N Negative 10/03/2016   N Negative 10/03/2016   No results found for: GCPROBEAPT Lab Results  Component Value Date   QUANTGOLD NEGATIVE 10/03/2016   No results found for: RPR    Problem List Items Addressed This Visit      Respiratory   Viral URI    Symptoms consistent with viral URI. Advised supportive care for now. To return as needed should she not have resolution in symptoms after a few weeks or worsening of current symptoms.         Other   Frequent headaches    May be r/t her menses. Advised to try OTC Excedrin as needed for headaches since they are infrequent as of now. Also may be she is having a problem with hypertension and may need to control with meds.       HIV (human immunodeficiency virus infection) ([place]) (Chronic)    Continue Dapsone, Prezcobix/Descovy. Will go ahead and check VL/CD4 today so she does not need to come back as soon. Follow up in 2-3 months (she prefers q35mvisits for comfort). Reminded him about ADAP enrollment starting January 2019.         Seasonal allergies    Instructed to add daily loratadine or cetirizine to help control allergic component. Benadryl at night to help with PND and sleeping.       Transient elevated blood pressure    Advised she may need to consider BP reducing agent as her last few BPs were elevated. She has been taking cough/cold medications so could be d/t pseudoephedrine / neosynephrine today. Will continue to monitor.        Other Visit Diagnoses    AIDS (acquired immune deficiency syndrome) ([facility]    -  Primary   Relevant Orders   HIV 1 RNA quant-no reflex-bld   T-helper cell (CD4)- ([facility] clinic only)      SJanene Madeira MSN, NP-C [facility] 03/20/17 2:55 PM

## 2017-03-20 NOTE — Assessment & Plan Note (Signed)
Symptoms consistent with viral URI. Advised supportive care for now. To return as needed should she not have resolution in symptoms after a few weeks or worsening of current symptoms.

## 2017-03-20 NOTE — Assessment & Plan Note (Signed)
May be r/t her menses. Advised to try OTC Excedrin as needed for headaches since they are infrequent as of now. Also may be she is having a problem with hypertension and may need to control with meds.

## 2017-03-21 LAB — T-HELPER CELL (CD4) - (RCID CLINIC ONLY)
CD4 T CELL HELPER: 8 % — AB (ref 33–55)
CD4 T Cell Abs: 100 /uL — ABNORMAL LOW (ref 400–2700)

## 2017-03-22 LAB — HIV-1 RNA QUANT-NO REFLEX-BLD
HIV 1 RNA Quant: 29 copies/mL — ABNORMAL HIGH
HIV-1 RNA QUANT, LOG: 1.46 {Log_copies}/mL — AB

## 2017-04-06 ENCOUNTER — Ambulatory Visit: Payer: Self-pay

## 2017-04-06 ENCOUNTER — Encounter: Payer: Self-pay | Admitting: Infectious Diseases

## 2017-04-09 ENCOUNTER — Ambulatory Visit: Payer: Self-pay

## 2017-04-09 ENCOUNTER — Encounter (HOSPITAL_COMMUNITY): Payer: Self-pay | Admitting: Student

## 2017-04-09 ENCOUNTER — Inpatient Hospital Stay (HOSPITAL_COMMUNITY)
Admission: AD | Admit: 2017-04-09 | Discharge: 2017-04-10 | Disposition: A | Discharge status: Home / Self Care | Payer: Self-pay | Source: Ambulatory Visit | Attending: Emergency Medicine | Admitting: Emergency Medicine

## 2017-04-09 ENCOUNTER — Inpatient Hospital Stay (HOSPITAL_COMMUNITY): Payer: Self-pay

## 2017-04-09 DIAGNOSIS — R079 Chest pain, unspecified: Secondary | ICD-10-CM

## 2017-04-09 DIAGNOSIS — R0789 Other chest pain: Secondary | ICD-10-CM | POA: Insufficient documentation

## 2017-04-09 DIAGNOSIS — Z79899 Other long term (current) drug therapy: Secondary | ICD-10-CM | POA: Insufficient documentation

## 2017-04-09 DIAGNOSIS — R0602 Shortness of breath: Secondary | ICD-10-CM | POA: Insufficient documentation

## 2017-04-09 DIAGNOSIS — J40 Bronchitis, not specified as acute or chronic: Secondary | ICD-10-CM

## 2017-04-09 DIAGNOSIS — B20 Human immunodeficiency virus [HIV] disease: Secondary | ICD-10-CM | POA: Insufficient documentation

## 2017-04-09 DIAGNOSIS — Z87891 Personal history of nicotine dependence: Secondary | ICD-10-CM | POA: Insufficient documentation

## 2017-04-09 DIAGNOSIS — I10 Essential (primary) hypertension: Secondary | ICD-10-CM | POA: Insufficient documentation

## 2017-04-09 DIAGNOSIS — Z7982 Long term (current) use of aspirin: Secondary | ICD-10-CM | POA: Insufficient documentation

## 2017-04-09 LAB — CBC WITH DIFFERENTIAL/PLATELET
BASOS ABS: 0 10*3/uL (ref 0.0–0.1)
Basophils Relative: 1 %
EOS ABS: 0.1 10*3/uL (ref 0.0–0.7)
EOS PCT: 2 %
HCT: 37.7 % (ref 36.0–46.0)
Hemoglobin: 11.6 g/dL — ABNORMAL LOW (ref 12.0–15.0)
LYMPHS PCT: 32 %
Lymphs Abs: 1.3 10*3/uL (ref 0.7–4.0)
MCH: 29 pg (ref 26.0–34.0)
MCHC: 30.8 g/dL (ref 30.0–36.0)
MCV: 94.3 fL (ref 78.0–100.0)
MONO ABS: 0.3 10*3/uL (ref 0.1–1.0)
Monocytes Relative: 8 %
Neutro Abs: 2.4 10*3/uL (ref 1.7–7.7)
Neutrophils Relative %: 57 %
PLATELETS: 251 10*3/uL (ref 150–400)
RBC: 4 MIL/uL (ref 3.87–5.11)
RDW: 14.5 % (ref 11.5–15.5)
WBC: 4.1 10*3/uL (ref 4.0–10.5)

## 2017-04-09 LAB — URINALYSIS, ROUTINE W REFLEX MICROSCOPIC
BILIRUBIN URINE: NEGATIVE
Bacteria, UA: NONE SEEN
Glucose, UA: NEGATIVE mg/dL
KETONES UR: NEGATIVE mg/dL
LEUKOCYTES UA: NEGATIVE
Nitrite: NEGATIVE
Protein, ur: 30 mg/dL — AB
Specific Gravity, Urine: 1.016 (ref 1.005–1.030)
pH: 5 (ref 5.0–8.0)

## 2017-04-09 LAB — COMPREHENSIVE METABOLIC PANEL
ALT: 11 U/L — ABNORMAL LOW (ref 14–54)
AST: 18 U/L (ref 15–41)
Albumin: 3.2 g/dL — ABNORMAL LOW (ref 3.5–5.0)
Alkaline Phosphatase: 88 U/L (ref 38–126)
Anion gap: 7 (ref 5–15)
BUN: 12 mg/dL (ref 6–20)
CALCIUM: 8.7 mg/dL — AB (ref 8.9–10.3)
CO2: 25 mmol/L (ref 22–32)
CREATININE: 0.67 mg/dL (ref 0.44–1.00)
Chloride: 104 mmol/L (ref 101–111)
GFR calc non Af Amer: >60 mL/min (ref >60)
GLUCOSE: 89 mg/dL (ref 65–99)
POTASSIUM: 3.9 mmol/L (ref 3.5–5.1)
SODIUM: 136 mmol/L (ref 135–145)
TOTAL PROTEIN: 7.5 g/dL (ref 6.5–8.1)
Total Bilirubin: 0.6 mg/dL (ref 0.3–1.2)

## 2017-04-09 LAB — POCT PREGNANCY, URINE: PREG TEST UR: NEGATIVE

## 2017-04-09 LAB — GLUCOSE, CAPILLARY: Glucose-Capillary: 98 mg/dL (ref 65–99)

## 2017-04-09 NOTE — MAU Note (Signed)
[facility] called, but unavailable. Called [facility]. Arrived at 1940 but bariatric stretcher not available. Waiting on stretcher to transport patient to [facility]

## 2017-04-09 NOTE — MAU Provider Note (Signed)
History     CSN: [id number]  Arrival date and time: 04/09/17 1854   None     Chief Complaint  Patient presents with  . Shortness of Breath  . Chest Pain   HPI Sara Osborne is a 37 y.o. female who presents with chest pain & SOB. PMH significant for HIV & morbid obesity (550 lbs). Current symptoms began this morning. Initially went to [facility] but "saw the wait & decided to come here".  Reports substernal chest pain that is constant but worse with walking. Also reports worsened dyspnea. States she normally is SOB on exertion but today is worse. Feels like she has a loss of sensation throughout her body & thought she might be "having a stroke".   Past Medical History:  Diagnosis Date  . Abnormal Pap smear of cervix 11/05/2009   LGSIL/ASCUS with colposcopy/biopsy 12/2009  . Allergic rhinitis   . 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 Completed tx.   Marland Kitchen HIV (human immunodeficiency virus infection) (HCC) 10/03/2016  . Knee pain, bilateral 11/06/2016  . Morbid obesity (HCC)    514 lb 2014  . Seasonal allergies 10/03/2016    Past Surgical History:  Procedure Laterality Date  . CESAREAN SECTION  2003    Family History  Problem Relation Age of Onset  . Hypertension Mother   . Hypertension Father   . Heart attack Father     Social History   Tobacco Use  . Smoking status: Former Smoker    Types: Cigarettes    Last attempt to quit: 08/04/2015    Years since quitting: 1.6  . Smokeless tobacco: Never Used  Substance Use Topics  . Alcohol use: No  . Drug use: No    Allergies:  Allergies  Allergen Reactions  . Ginger Hives  . Bactrim [Sulfamethoxazole-Trimethoprim] Rash    Fever  . Sulfa Antibiotics Rash    Medications Prior to Admission  Medication Sig Dispense Refill Last Dose  . aspirin 81 MG chewable tablet Chew 1 tablet (81 mg total) by mouth daily. (Patient not taking: Reported on 03/20/2017) 30  tablet 0 Not Taking  . azithromycin (ZITHROMAX) 600 MG tablet Take 2 tablets (1,200 mg total) by mouth once a week. 8 tablet 2 Taking  . chlorpheniramine-HYDROcodone (TUSSIONEX PENNKINETIC ER) 10-8 MG/5ML SUER Take 5 mLs by mouth at bedtime as needed for cough. 140 mL 0   . dapsone 100 MG tablet Take 1 tablet (100 mg total) by mouth daily. 30 tablet 2 Taking  . DESCOVY 200-25 MG tablet TAKE 1 TABLET BY MOUTH DAILY 30 tablet 2 Taking  . fluconazole (DIFLUCAN) 200 MG tablet Take 1 tablet (200 mg total) by mouth daily. (Patient not taking: Reported on 11/06/2016) 10 tablet 0 Not Taking  . PREZCOBIX 800-150 MG tablet TAKE 1 TABLET BY MOUTH DAILY. SWALLOW WHOLE. DO NOT CRUSH, BREAK, OR CHEW TABLETS. TAKE WITH FOOD. 30 tablet 2 Taking    Review of Systems  Respiratory: Positive for shortness of breath. Negative for cough and wheezing.   Cardiovascular: Positive for chest pain. Negative for palpitations.  Gastrointestinal: Negative.   Neurological: Positive for weakness and headaches. Negative for seizures.   Physical Exam   Blood pressure (!) 188/101, pulse 98, temperature 98.7 F (37.1 C), temperature source Oral, last menstrual period 04/03/2017, SpO2 98 %.  Physical Exam  Nursing note and vitals reviewed. Constitutional: She is oriented to person, place, and time. She appears well-developed and well-nourished.  No distress.  Morbidly obese AA female  HENT:  Head: Normocephalic and atraumatic.  Eyes: Conjunctivae are normal. Right eye exhibits no discharge. Left eye exhibits no discharge. No scleral icterus.  Neck: Normal range of motion.  Cardiovascular: Normal rate, regular rhythm and normal heart sounds.  No murmur heard. Respiratory: Breath sounds normal. Tachypnea noted. No respiratory distress. She has no wheezes.  Increased effort  GI: Soft.  Neurological: She is alert and oriented to person, place, and time.  CPSS negative x3  Skin: Skin is warm and dry. She is not diaphoretic.   Psychiatric: She has a normal mood and affect. Her behavior is normal. Judgment and thought content normal.    MAU Course  Procedures Results for orders placed or performed during the hospital encounter of 04/09/17 (from the past 24 hour(s))  Glucose, capillary     Status: None   Collection Time: 04/09/17  7:22 PM  Result Value Ref Range   Glucose-Capillary 98 65 - 99 mg/dL    MDM Limited neuro exam negative EKG -- NSR Elevated BP Patient is non pregnant with complaints of CP & SOB. Initial screening exam performed & believe patient should be transferred out for further evaluation of symptoms.  Discussed patient with [facility] of care  Assessment and Plan  A: 1. Chest pain, unspecified type   2. SOB (shortness of breath)   3. Hypertension, unspecified type    P: Transfer to [facility] Dr. Jeraldine LootsLockwood accepting physician [facility] not available immediately -- will cal [facility] for transfer  Judeth Hornrin Achol Azpeitia 04/09/2017, 7:17 PM

## 2017-04-09 NOTE — MAU Note (Signed)
EMS arrived with bariatric stretcher.

## 2017-04-09 NOTE — ED Provider Notes (Signed)
[facility] Provider Note   CSN: [id number] Arrival date & time: 04/09/17  1854     History   Chief Complaint Chief Complaint  Patient presents with  . Shortness of Breath  . Chest Pain    HPI Sara Osborne is a 37 y.o. female.  HPI Presents with concern of weakness and fatigue. Onset seems to have been earlier today, though she notes she has had prior similar episodes, as recently as 3 weeks ago. Today a fever, no chills, no cough, no syncope, no chest pain. She did have a brief headache, similar to prior episodes, but none currently. No recent changes in medication, diet, activity. Notably, the patient came to our emergency department earlier today, left prior to being seen, went to another facility, and after being evaluated there by women's health practitioners, she was sent here for further evaluation.  I discussed the patient's presentation to that facility with our nurse practitioner.   Past Medical History:  Diagnosis Date  . Abnormal Pap smear of cervix 11/05/2009   LGSIL/ASCUS with colposcopy/biopsy 12/2009  . Allergic rhinitis   . 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 Completed tx.   Marland Kitchen. HIV (human immunodeficiency virus infection) (HCC) 10/03/2016  . Knee pain, bilateral 11/06/2016  . Morbid obesity (HCC)    514 lb 2014  . Seasonal allergies 10/03/2016    Patient Active Problem List   Diagnosis Date Noted  . Frequent headaches 03/20/2017  . Transient elevated blood pressure 03/20/2017  . Viral URI 03/20/2017  . Chest pain 01/15/2017  . Knee pain, bilateral 11/06/2016  . Healthcare maintenance 11/06/2016  . HIV (human immunodeficiency virus infection) (HCC) 10/03/2016  . Obesity 10/03/2016  . Seasonal allergies 10/03/2016    Past Surgical History:  Procedure Laterality Date  . CESAREAN SECTION  2003    OB History    Gravida Para Term  Preterm AB Living   2             SAB TAB Ectopic Multiple Live Births                   Home Medications    Prior to Admission medications   Medication Sig Start Date End Date Taking? Authorizing Provider  aspirin 81 MG chewable tablet Chew 1 tablet (81 mg total) by mouth daily. Patient not taking: Reported on 03/20/2017 03/11/17   Horton, Mayer Maskerourtney F, MD  azithromycin (ZITHROMAX) 600 MG tablet Take 2 tablets (1,200 mg total) by mouth once a week. 01/17/17   Blanchard Kelchixon, Stephanie N, NP  chlorpheniramine-HYDROcodone (TUSSIONEX PENNKINETIC ER) 10-8 MG/5ML SUER Take 5 mLs by mouth at bedtime as needed for cough. 03/20/17   Blanchard Kelchixon, Stephanie N, NP  dapsone 100 MG tablet Take 1 tablet (100 mg total) by mouth daily. 01/17/17   Blanchard Kelchixon, Stephanie N, NP  DESCOVY 200-25 MG tablet TAKE 1 TABLET BY MOUTH DAILY 03/14/17   Blanchard Kelchixon, Stephanie N, NP  fluconazole (DIFLUCAN) 200 MG tablet Take 1 tablet (200 mg total) by mouth daily. Patient not taking: Reported on 11/06/2016 10/03/16   Kuppelweiser, Cassie L, RPH-CPP  PREZCOBIX 800-150 MG tablet TAKE 1 TABLET BY MOUTH DAILY. SWALLOW WHOLE. DO NOT CRUSH, BREAK, OR CHEW TABLETS. TAKE WITH FOOD. 03/14/17   Blanchard Kelchixon, Stephanie N, NP    Family History Family History  Problem Relation Age of Onset  . Hypertension Mother   . Hypertension Father   . Heart attack Father  Social History Social History   Tobacco Use  . Smoking status: Former Smoker    Types: Cigarettes    Last attempt to quit: 08/04/2015    Years since quitting: 1.6  . Smokeless tobacco: Never Used  Substance Use Topics  . Alcohol use: No  . Drug use: No     Allergies   Ginger; Bactrim [sulfamethoxazole-trimethoprim]; and Sulfa antibiotics   Review of Systems Review of Systems  Constitutional:       Per HPI, otherwise negative  HENT:       Per HPI, otherwise negative  Respiratory:       Per HPI, otherwise negative  Cardiovascular:       Per HPI, otherwise negative  Gastrointestinal:  Negative for vomiting.  Endocrine:       Negative aside from HPI  Genitourinary:       Neg aside from HPI   Musculoskeletal:       Per HPI, otherwise negative  Skin: Negative.   Allergic/Immunologic: Positive for immunocompromised state.  Neurological: Positive for weakness. Negative for syncope.     Physical Exam Updated Vital Signs BP (!) 167/92   Pulse 99   Temp 98.7 F (37.1 C) (Oral)   Resp 16   LMP 04/03/2017   SpO2 98%   Physical Exam  Constitutional: She is oriented to person, place, and time. She appears well-developed and well-nourished. No distress.  Morbidly obese young female awake and alert, on a mobile telephone, and communicating with her female companion with no evidence for distress, difficulty speaking or breathing.  HENT:  Head: Normocephalic and atraumatic.  Eyes: Conjunctivae and EOM are normal.  Cardiovascular: Normal rate and regular rhythm.  Pulmonary/Chest: Effort normal. No stridor. No respiratory distress. She has decreased breath sounds.  Abdominal: She exhibits no distension.  Large abdomen  Musculoskeletal: She exhibits no edema.  Neurological: She is alert and oriented to person, place, and time. No cranial nerve deficit.  Skin: Skin is warm and dry.  Psychiatric: She has a normal mood and affect.  Nursing note and vitals reviewed.    ED Treatments / Results  Labs (all labs ordered are listed, but only abnormal results are displayed) Labs Reviewed  URINALYSIS, ROUTINE W REFLEX MICROSCOPIC - Abnormal; Notable for the following components:      Result Value   APPearance HAZY (*)    Hgb urine dipstick LARGE (*)    Protein, ur 30 (*)    Squamous Epithelial / LPF 0-5 (*)    All other components within normal limits  COMPREHENSIVE METABOLIC PANEL - Abnormal; Notable for the following components:   Calcium 8.7 (*)    Albumin 3.2 (*)    ALT 11 (*)    All other components within normal limits  CBC WITH DIFFERENTIAL/PLATELET - Abnormal;  Notable for the following components:   Hemoglobin 11.6 (*)    All other components within normal limits  GLUCOSE, CAPILLARY  POCT PREGNANCY, URINE    EKG E[facility] from [facility] reviewed, nonischemic.  Radiology Dg Chest Port 1 View  Result Date: 04/10/2017 CLINICAL DATA:  Chest pain and shortness of breath. EXAM: PORTABLE CHEST 1 VIEW COMPARISON:  None. FINDINGS: The heart is enlarged. There is vascular congestion. Mild peribronchial cuffing may be pulmonary edema. No large pneumothorax or pleural effusion. No focal airspace disease. Soft tissue attenuation from body habitus limits assessment. IMPRESSION: Cardiomegaly with vascular congestion. Bronchial thickening may reflect pulmonary edema or bronchial inflammation. Electronically Signed   By: Rubye Oaks  M.D.   On: 04/10/2017 00:36    Procedures Procedures (including critical care time)  Medications Ordered in ED Medications - No data to display   Initial Impression / Assessment and Plan / ED Course  I have reviewed the triage vital signs and the nursing notes.  Pertinent labs & imaging results that were available during my care of the patient were reviewed by me and considered in my medical decision making (see chart for details).  Review after the initial evaluation performed. Notable for her ED visits, and one visit 3 weeks ago after similar circumstances.  12:59 AM Patient in no distress, speaking clearly. I discussed all findings with her and her female companion. She remains awake and alert, hemodynamically unremarkable. Discussed possibility, including likely to bronchitis. No evidence for pneumonia, no fever, no increased work of breathing, no evidence for PE. Patient has no history of congestive heart failure. Patient will start a course of steroids, albuterol, follow-up with primary care and/or ID this week.  Final Clinical Impressions(s) / ED Diagnoses   Final diagnoses:  Chest pain, unspecified type    SOB (shortness of breath)  Hypertension, unspecified type    ED Discharge Orders    None       Gerhard Munch, MD 04/10/17 0100

## 2017-04-09 NOTE — MAU Note (Addendum)
Pt has felt sluggish today and feels SOB. Has been having chest pain. Symptoms started this morning. Pt was feeling loss of sensation on her body. Thought she was having slurred speech.  Heavy feeling in her body.

## 2017-04-10 ENCOUNTER — Other Ambulatory Visit: Payer: Self-pay | Admitting: Infectious Diseases

## 2017-04-10 DIAGNOSIS — B20 Human immunodeficiency virus [HIV] disease: Secondary | ICD-10-CM

## 2017-04-10 MED ORDER — PREDNISONE 20 MG PO TABS
60.0000 mg | ORAL_TABLET | ORAL | Status: AC
  Administered 2017-04-10: 60 mg via ORAL
  Filled 2017-04-10: qty 3

## 2017-04-10 MED ORDER — PREDNISONE 20 MG PO TABS: 40.0000 mg | ORAL_TABLET | Freq: Every day | ORAL | 0 refills | Status: DC

## 2017-04-10 MED ORDER — ALBUTEROL SULFATE HFA 108 (90 BASE) MCG/ACT IN AERS
2.0000 | INHALATION_SPRAY | Freq: Four times a day (QID) | RESPIRATORY_TRACT | Status: DC
  Administered 2017-04-10: 2 via RESPIRATORY_TRACT
  Filled 2017-04-10: qty 6.7

## 2017-04-10 NOTE — ED Notes (Signed)
PT states understanding of care given, follow up care, and medication prescribed. PT ambulated from ED to car with a steady gait. 

## 2017-04-10 NOTE — Discharge Instructions (Signed)
As discussed, today's evaluation has been generally reassuring. Please use the prescribed steroids, as well as the provided albuterol.  Return here for concerning changes in your condition, otherwise be sure to follow-up with your physician within 1 week.

## 2017-04-11 ENCOUNTER — Encounter: Payer: Self-pay | Admitting: Infectious Diseases

## 2017-04-12 ENCOUNTER — Encounter: Payer: Self-pay | Admitting: Infectious Diseases

## 2017-04-12 ENCOUNTER — Ambulatory Visit (INDEPENDENT_AMBULATORY_CARE_PROVIDER_SITE_OTHER): Payer: Self-pay | Admitting: Infectious Diseases

## 2017-04-12 VITALS — BP 179/94 | HR 108 | Temp 97.3°F | Wt >= 6400 oz

## 2017-04-12 DIAGNOSIS — B20 Human immunodeficiency virus [HIV] disease: Secondary | ICD-10-CM

## 2017-04-12 DIAGNOSIS — I517 Cardiomegaly: Secondary | ICD-10-CM | POA: Insufficient documentation

## 2017-04-12 DIAGNOSIS — I10 Essential (primary) hypertension: Secondary | ICD-10-CM

## 2017-04-12 MED ORDER — LISINOPRIL-HYDROCHLOROTHIAZIDE 10-12.5 MG PO TABS: 1.0000 | ORAL_TABLET | Freq: Every day | ORAL | 2 refills | Status: DC

## 2017-04-12 NOTE — Assessment & Plan Note (Signed)
Discussed CD4 results with Benjamine Mola today. I counseled her that with her immune system being so damaged by her untreated HIV for such a long time it may be that we need to have her undetectable (to which she was not 1 yet 1 month ago) for many months before we see much more reconstitution of her immune system and that there is a potential that she may never achieve CD4 > 200. Continue Descovy and Prezcobix along with Dapsone and weekly Azithromycin (MAC secondary prophylaxis). Check VL today. She will return in 1-2 months.

## 2017-04-12 NOTE — Assessment & Plan Note (Addendum)
Incidental finding on recent CXR in setting of prolonged URI/bronchitis. Reviewed images with her and her fiance and concern regarding heart function. She has noticed increased bloating in her abdomen, fatigue and swelling of LEs. With her body weight/size it is difficult to determine exclusively how much heart failure is contributing however she will need work up with cardiology team. Check BNP today and orders placed for complete echocardiogram and referral to cardiology. May need to change HCTZ to lasix as I fear her ongoing URI symptoms are more due to pulmonary edema. I asked her to stop the prednisone early as this can cause to worsening of fluid retention.

## 2017-04-12 NOTE — Progress Notes (Signed)
Sara Osborne  10/20/1980 [id number] PCP:  Callas, NP   Reason for Visit: Routine HIV care  Brief Narrative: Sara Osborne is a 37 y.o. AA female with HIV infection. Originally dx in 1999 during her first pregnancy. Previous regimens: Combivir/Viracept early 2000s. Atripla from 2011 - 2014. Out of care from 2005 - 2011. OIs: MAC (+blood culture, asymptomatic) and tx with ethambutol/azithro/rifabutin x 12 months. HIV Risk: heterosexual unprotected sex Genotype: 10/2016 [id number]   Patient Active Problem List   Diagnosis Date Noted  . Cardiomegaly 04/12/2017  . Frequent headaches 03/20/2017  . Hypertension 03/20/2017  . Viral URI 03/20/2017  . Chest pain 01/15/2017  . Knee pain, bilateral 11/06/2016  . Healthcare maintenance 11/06/2016  . AIDS (acquired immune deficiency syndrome) ([place]) 10/03/2016  . Obesity, morbid, BMI 50 or higher ([place]) 10/03/2016  . Seasonal allergies 10/03/2016    HPI:  Sara Osborne is here today with her fiance after having a visit to the ED for worsening cough, peripheral numbness and 'just not feeling right.' She was given a prednisone taper for bronchitis per her recent CXR results indicating this is her primary issue. She is concerned because there was an indication her heart was enlarged and that there may be some pulmonary edema. She has had ongoing cough despite her inhaler and prednisone. She is still taking her azithromycin weekly and dapsone for prophylaxis. She is most concerned over the chest pain, fatigue/SOB and now chest x ray findings.    Review of Systems  Constitutional: Negative for chills, fever, malaise/fatigue and weight loss.  HENT: Negative for congestion, ear pain and sore throat.   Respiratory: Positive for cough. Negative for sputum production and shortness of breath.   Cardiovascular: Negative for chest pain and leg swelling.  Gastrointestinal: Negative for abdominal pain, diarrhea and vomiting.  Genitourinary: Negative for dysuria  and urgency.  Musculoskeletal: Positive for joint pain (knee and feet bilaterally ). Negative for myalgias and neck pain.  Skin: Negative for rash.  Neurological: Positive for headaches.  Psychiatric/Behavioral: Negative for depression and substance abuse. The patient is not nervous/anxious.    Past Medical History:  Diagnosis Date  . Abnormal Pap smear of cervix 11/05/2009   LGSIL/ASCUS with colposcopy/biopsy 12/2009  . Allergic rhinitis   . Disseminated mycobacterium avium-intracellulare complex ([place]) 09/2009   Dx via BCx - asymptomatic at the time. Completed tx.   Marland Kitchen HIV (human immunodeficiency virus infection) ([place]) 10/03/2016  . Knee pain, bilateral 11/06/2016  . Morbid obesity ([place])    514 lb 2014  . Seasonal allergies 10/03/2016   Outpatient Medications Prior to Visit  Medication Sig Dispense Refill  . acetaminophen (TYLENOL) 500 MG tablet Take 500-1,000 mg by mouth every 6 (six) hours as needed for mild pain.    . ALBUTEROL SULFATE IN Inhale into the lungs.    Marland Kitchen aspirin 325 MG tablet Take 650 mg by mouth every 6 (six) hours as needed for mild pain.    Marland Kitchen azithromycin (ZITHROMAX) 600 MG tablet Take 1,200 mg by mouth once a week.    . chlorpheniramine-HYDROcodone (TUSSIONEX PENNKINETIC ER) 10-8 MG/5ML SUER Take 5 mLs by mouth at bedtime as needed for cough. 140 mL 0  . dapsone 100 MG tablet TAKE 1 TABLET BY MOUTH DAILY 30 tablet 3  . DESCOVY 200-25 MG tablet TAKE 1 TABLET BY MOUTH DAILY 30 tablet 2  . predniSONE (DELTASONE) 20 MG tablet Take 2 tablets (40 mg total) by mouth daily with breakfast. For the next four days 8 tablet  0  . PREZCOBIX 800-150 MG tablet TAKE 1 TABLET BY MOUTH DAILY. SWALLOW WHOLE. DO NOT CRUSH, BREAK, OR CHEW TABLETS. TAKE WITH FOOD. 30 tablet 2   No facility-administered medications prior to visit.    Allergies  Allergen Reactions  . Ginger Hives  . Bactrim [Sulfamethoxazole-Trimethoprim] Rash    Fever  . Sulfa Antibiotics Rash   Social History   Tobacco  Use  . Smoking status: Former Smoker    Types: Cigarettes    Last attempt to quit: 08/04/2015    Years since quitting: 1.6  . Smokeless tobacco: Never Used  Substance Use Topics  . Alcohol use: No  . Drug use: No   Objective: Vitals:   04/12/17 1424  BP: (!) 179/94  Pulse: (!) 108  Temp: (!) 97.3 F (36.3 C)  TempSrc: Oral  Weight: (!) 567 lb (257.2 kg)   Body mass index is 97.33 kg/m.  Physical Exam  Constitutional: She is oriented to person, place, and time and well-developed, well-nourished, and in no distress.  Obese pleasant female accompanied by her fiance today.   HENT:  Mouth/Throat: Oropharynx is clear and moist. No oral lesions. No dental abscesses.  Eyes: EOM are normal. Pupils are equal, round, and reactive to light. No scleral icterus.  Cardiovascular: Normal rate, regular rhythm and normal heart sounds.  Pulmonary/Chest: Effort normal and breath sounds normal. No respiratory distress. She has no wheezes. She has no rales.  Abdominal: Soft. She exhibits no distension. There is no tenderness.  Musculoskeletal: She exhibits no tenderness.  Lymphadenopathy:    She has no cervical adenopathy.  Neurological: She is alert and oriented to person, place, and time. No cranial nerve deficit.  Skin: Skin is warm and dry. No rash noted.  Psychiatric: Mood, affect and judgment normal.   Lab Results Lab Results  Component Value Date   WBC 4.1 04/09/2017   HGB 11.6 (L) 04/09/2017   HCT 37.7 04/09/2017   MCV 94.3 04/09/2017   PLT 251 04/09/2017    Lab Results  Component Value Date   CREATININE 0.67 04/09/2017   BUN 12 04/09/2017   NA 136 04/09/2017   K 3.9 04/09/2017   CL 104 04/09/2017   CO2 25 04/09/2017    Lab Results  Component Value Date   ALT 11 (L) 04/09/2017   AST 18 04/09/2017   ALKPHOS 88 04/09/2017   BILITOT 0.6 04/09/2017    Lab Results  Component Value Date   CHOL 180 10/23/2016   HDL 56 10/23/2016   LDLCALC 111 (H) 10/23/2016   TRIG 66  10/23/2016   CHOLHDL 3.2 10/23/2016   HIV 1 RNA Quant  Date Value  03/20/2017 29 copies/mL (H)  01/15/2017 CANCELED  01/15/2017 119 copies/mL (H)   CD4 T Cell Abs (/uL)  Date Value  03/20/2017 100 (L)  01/15/2017 120 (L)  11/06/2016 220 (L)   Lab Results  Component Value Date   HAV NON REACTIVE 10/03/2016   Lab Results  Component Value Date   HEPBSAG NEGATIVE 10/03/2016   HEPBSAB POS (A) 10/03/2016   No results found for: HCVAB Lab Results  Component Value Date   CHLAMYDIAWP Negative 10/03/2016   CHLAMYDIAWP Negative 10/03/2016   N Negative 10/03/2016   N Negative 10/03/2016   No results found for: GCPROBEAPT Lab Results  Component Value Date   QUANTGOLD NEGATIVE 10/03/2016   No results found for: RPR    Problem List Items Addressed This Visit      Cardiovascular and Mediastinum  Cardiomegaly    Incidental finding on recent CXR in setting of prolonged URI/bronchitis. Reviewed images with her and her fiance and concern regarding heart function. She has noticed increased bloating in her abdomen, fatigue and swelling of LEs. With her body weight/size it is difficult to determine exclusively how much heart failure is contributing however she will need work up with cardiology team. Check BNP today and orders placed for complete echocardiogram and referral to cardiology.       Relevant Medications   lisinopril-hydrochlorothiazide (PRINZIDE,ZESTORETIC) 10-12.5 MG tablet   Other Relevant Orders   B Nat Peptide   ECHOCARDIOGRAM COMPLETE   Ambulatory referral to Cardiology   Hypertension    She has been very reluctant to start medications for hypertension. She has continued to gain weight since moving to [place] and is morbidly obese. I spent a long time talking with her today that with the findings of her enlarged heart on xray +/- pulmonary edema she needs to consider adding these medications as she is at risk for ongoing stress to her heart. She tearfully accepted she will  look into these medications. Will send lisinpril-hctz 10/12.5 mg to start as she has been pretty sensitive to medications. Likely will need to titrate up and add beta blocker if we cannot get her in to see cardiology.       Relevant Medications   lisinopril-hydrochlorothiazide (PRINZIDE,ZESTORETIC) 10-12.5 MG tablet   Other Relevant Orders   Ambulatory referral to Cardiology     Other   AIDS (acquired immune deficiency syndrome) ([place]) - Primary    Discussed CD4 results with Sara Osborne today. I counseled her that with her immune system being so damaged by her untreated HIV for such a long time it may be that we need to have her undetectable (to which she was not 1 yet 1 month ago) for many months before we see much more reconstitution of her immune system and that there is a potential that she may never achieve CD4 > 200. Continue Descovy and Prezcobix along with Dapsone and weekly Azithromycin (MAC secondary prophylaxis). Check VL today. She will return in 1-2 months.         I spent greater than 40 minutes with the patient including greater than 50% of time in face to face counsel of the patient re HIV, recent lab and cxr results, htn, cardiomegaly and in coordination of their care.  Janene Madeira, MSN, NP-C [facility] 04/12/17 3:29 PM

## 2017-04-12 NOTE — Assessment & Plan Note (Addendum)
She has been very reluctant to start medications for hypertension. She has continued to gain weight since moving to [place] and is morbidly obese. I spent a long time talking with her today that with the findings of her enlarged heart on xray +/- pulmonary edema she needs to consider adding these medications as she is at risk for ongoing stress to her heart. She tearfully accepted she will look into these medications. Will send lisinpril-hctz 10/12.5 mg to start as she has been pretty sensitive to medications. Likely will need to titrate up and add beta blocker if we cannot get her in to see cardiology.

## 2017-04-12 NOTE — Patient Instructions (Signed)
Will arrange for an echocardiogram (heart ultrasound) to look at the function of how it is pumping. Your EKG (heart electricity) is normal. With your chest pain, elevated blood pressure and enlarged heart I will place a referral for cardiology as well.   Will check some blood work today including a viral load.   Please start a blood pressure medication called Lisinopril - hydrochlorothiazide. This will help take some fluid off that you may be retaining and help treat your blood pressure.   Please come back to see me in 1-2 months.

## 2017-04-13 LAB — BRAIN NATRIURETIC PEPTIDE: BRAIN NATRIURETIC PEPTIDE: 74 pg/mL (ref <100)

## 2017-04-13 NOTE — Progress Notes (Signed)
BNP negative. Hold off on lasix for now. Will go forth with echo d/t new cardiomegaly

## 2017-04-14 ENCOUNTER — Other Ambulatory Visit: Payer: Self-pay

## 2017-04-14 ENCOUNTER — Encounter (HOSPITAL_COMMUNITY): Payer: Self-pay | Admitting: Emergency Medicine

## 2017-04-14 ENCOUNTER — Emergency Department (HOSPITAL_COMMUNITY): Payer: Self-pay

## 2017-04-14 ENCOUNTER — Emergency Department (HOSPITAL_COMMUNITY)
Admission: EM | Admit: 2017-04-14 | Discharge: 2017-04-14 | Disposition: A | Discharge status: Home / Self Care | Payer: Self-pay | Attending: Emergency Medicine | Admitting: Emergency Medicine

## 2017-04-14 DIAGNOSIS — J40 Bronchitis, not specified as acute or chronic: Secondary | ICD-10-CM

## 2017-04-14 DIAGNOSIS — Z7982 Long term (current) use of aspirin: Secondary | ICD-10-CM | POA: Insufficient documentation

## 2017-04-14 DIAGNOSIS — Z79899 Other long term (current) drug therapy: Secondary | ICD-10-CM | POA: Insufficient documentation

## 2017-04-14 DIAGNOSIS — J302 Other seasonal allergic rhinitis: Secondary | ICD-10-CM | POA: Insufficient documentation

## 2017-04-14 DIAGNOSIS — Z87891 Personal history of nicotine dependence: Secondary | ICD-10-CM | POA: Insufficient documentation

## 2017-04-14 DIAGNOSIS — R0602 Shortness of breath: Secondary | ICD-10-CM | POA: Insufficient documentation

## 2017-04-14 LAB — I-STAT TROPONIN, ED
TROPONIN I, POC: 0 ng/mL (ref 0.00–0.08)
TROPONIN I, POC: 0 ng/mL (ref 0.00–0.08)

## 2017-04-14 LAB — BASIC METABOLIC PANEL
Anion gap: 12 (ref 5–15)
BUN: 11 mg/dL (ref 6–20)
CALCIUM: 8.7 mg/dL — AB (ref 8.9–10.3)
CO2: 23 mmol/L (ref 22–32)
CREATININE: 0.79 mg/dL (ref 0.44–1.00)
Chloride: 99 mmol/L — ABNORMAL LOW (ref 101–111)
GFR calc Af Amer: >60 mL/min (ref >60)
GLUCOSE: 142 mg/dL — AB (ref 65–99)
Potassium: 3.4 mmol/L — ABNORMAL LOW (ref 3.5–5.1)
Sodium: 134 mmol/L — ABNORMAL LOW (ref 135–145)

## 2017-04-14 LAB — I-STAT BETA HCG BLOOD, ED (MC, WL, AP ONLY): I-stat hCG, quantitative: <5 m[IU]/mL (ref <5)

## 2017-04-14 LAB — BRAIN NATRIURETIC PEPTIDE: B NATRIURETIC PEPTIDE 5: 44.3 pg/mL (ref 0.0–100.0)

## 2017-04-14 LAB — D-DIMER, QUANTITATIVE: D-Dimer, Quant: 0.42 ug/mL-FEU (ref 0.00–0.50)

## 2017-04-14 LAB — CBC
HCT: 40.1 % (ref 36.0–46.0)
Hemoglobin: 13 g/dL (ref 12.0–15.0)
MCH: 30.2 pg (ref 26.0–34.0)
MCHC: 32.4 g/dL (ref 30.0–36.0)
MCV: 93.3 fL (ref 78.0–100.0)
PLATELETS: 306 10*3/uL (ref 150–400)
RBC: 4.3 MIL/uL (ref 3.87–5.11)
RDW: 14.6 % (ref 11.5–15.5)
WBC: 7.4 10*3/uL (ref 4.0–10.5)

## 2017-04-14 MED ORDER — ALBUTEROL SULFATE HFA 108 (90 BASE) MCG/ACT IN AERS
2.0000 | INHALATION_SPRAY | Freq: Once | RESPIRATORY_TRACT | Status: AC
  Administered 2017-04-14: 2 via RESPIRATORY_TRACT
  Filled 2017-04-14: qty 6.7

## 2017-04-14 MED ORDER — DEXAMETHASONE SODIUM PHOSPHATE 10 MG/ML IJ SOLN
10.0000 mg | Freq: Once | INTRAMUSCULAR | Status: AC
  Administered 2017-04-14: 10 mg via INTRAMUSCULAR
  Filled 2017-04-14: qty 1

## 2017-04-14 MED ORDER — IOPAMIDOL (ISOVUE-370) INJECTION 76%
INTRAVENOUS | Status: AC
  Administered 2017-04-14: 100 mL
  Filled 2017-04-14: qty 100

## 2017-04-14 NOTE — ED Notes (Signed)
Pt ambulated to bathroom with steady gait. 

## 2017-04-14 NOTE — ED Notes (Signed)
Patient transported to CT 

## 2017-04-14 NOTE — ED Notes (Signed)
EDP at bedside  

## 2017-04-14 NOTE — ED Triage Notes (Signed)
Pt presents to ED for assessment after being diagnosed with cardiomegaly and bronchitis.  Pt states she finished her last dose of prednisone yesterday afternoon.  Today she states she noted worsening SOB, discoloration in her hands, and numbness in her throat.  Airway patent at this time.    PT STARTED LISINOPRIL LAST WEEK

## 2017-04-14 NOTE — ED Provider Notes (Signed)
[facility] EMERGENCY DEPARTMENT Provider Note   CSN: [id number] Arrival date & time: 04/14/17  1207     History   Chief Complaint Chief Complaint  Patient presents with  . Shortness of Breath    HPI Sara Osborne is a 37 y.o. female.  HPI   Patient is a 37 year old female with a history of HIV who presented to the ED today with multiple complaints including SOB, chest pain, discoloration to her hands, and numbness in her throat. She is concerned that she was recently noted to have cardiomegaly on CXR. She has no h/o heart failure.  Chest pain is intermittent, 2/10 and is midsternal. It is worse when she coughs, takes a deep breath, or presses on her chest. Also with SOB, decreased PO intake, nasal congestion, postnasal drip. Denies fevers. Patient also reporting numbness in her throat that she states began on 1/7 prior to starting lisinopril.  States symptoms improved temporarily a few days ago after gargling with warm salt water, drinking tea, and using cough drops.  States that symptoms returned today.  States that she feels her sxs  Are due to a "lack of fluids".  She denies any lip swelling, tongue swelling, or cough. Also reporting discoloration of her hands/fingertips.  Patient was recently seen in the ED on 1/7 complaining of chest pain, shortness of breath.  CXR on 1/7 showed "cardiomegaly with vascular congestion. Bronchial thickening which may reflect pulmonary edema or bronchial inflammation". No LE edema was noted on exam. Was d/c with albuterol, steroids, and advised outpatient f/u. ID saw pt on 1/10 and d/c'ed steroids. Also started pr on  lisinopril-hctz 10/12.5 and ordered ECHO which is yet to be done. ID was unsure how much heart failure was contributing to her sxs, and exam was difficult to interpret due to body habitus. Was seen prior to this on 12/18 for URI sxs.  Pt took 650 ASA PTA. Has also taken Tussinex, alka seltzer flu & cold. Denies LE  swelling, redness, or pain. No tobacco use or birth control. No family or personal h/o blood clot. No recent surgeries, hospitalizations, or periods of immobility. No h/o CA.    Past Medical History:  Diagnosis Date  . Abnormal Pap smear of cervix 11/05/2009   LGSIL/ASCUS with colposcopy/biopsy 12/2009  . Allergic rhinitis   . 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 Completed tx.   Marland Kitchen HIV (human immunodeficiency virus infection) (HCC) 10/03/2016  . Knee pain, bilateral 11/06/2016  . Morbid obesity (HCC)    514 lb 2014  . Seasonal allergies 10/03/2016    Patient Active Problem List   Diagnosis Date Noted  . Cardiomegaly 04/12/2017  . Frequent headaches 03/20/2017  . Hypertension 03/20/2017  . Viral URI 03/20/2017  . Chest pain 01/15/2017  . Knee pain, bilateral 11/06/2016  . Healthcare maintenance 11/06/2016  . AIDS (acquired immune deficiency syndrome) (HCC) 10/03/2016  . Obesity, morbid, BMI 50 or higher (HCC) 10/03/2016  . Seasonal allergies 10/03/2016    Past Surgical History:  Procedure Laterality Date  . CESAREAN SECTION  2003    OB History    Gravida Para Term Preterm AB Living   2             SAB TAB Ectopic Multiple Live Births                   Home Medications    Prior to Admission medications  Medication Sig Start Date End Date Taking? Authorizing Provider  acetaminophen (TYLENOL) 500 MG tablet Take 500-1,000 mg by mouth every 6 (six) hours as needed for mild pain.   Yes [provider]  aspirin 325 MG tablet Take 650 mg by mouth every 6 (six) hours as needed for mild pain.   Yes [provider]  aspirin-sod bicarb-citric acid (ALKA-SELTZER) 325 MG TBEF tablet Take 650 mg by mouth every 6 (six) hours as needed (Cold and congestion).   Yes [provider]  CHLORPHENIRAMINE-DM PO Take 1 tablet by mouth as needed (Cold and Flu symptoms).   Yes [provider]   chlorpheniramine-HYDROcodone (TUSSIONEX PENNKINETIC ER) 10-8 MG/5ML SUER Take 5 mLs by mouth at bedtime as needed for cough. 03/20/17  Yes Blanchard Kelchixon, Stephanie N, NP  dapsone 100 MG tablet TAKE 1 TABLET BY MOUTH DAILY 04/10/17  Yes Blanchard Kelchixon, Stephanie N, NP  DESCOVY 200-25 MG tablet TAKE 1 TABLET BY MOUTH DAILY 03/14/17  Yes Blanchard Kelchixon, Stephanie N, NP  lisinopril-hydrochlorothiazide (PRINZIDE,ZESTORETIC) 10-12.5 MG tablet Take 1 tablet by mouth daily. 04/12/17  Yes Blanchard Kelchixon, Stephanie N, NP  predniSONE (DELTASONE) 20 MG tablet Take 2 tablets (40 mg total) by mouth daily with breakfast. For the next four days 04/10/17  Yes Gerhard MunchLockwood, Robert, MD  PREZCOBIX 800-150 MG tablet TAKE 1 TABLET BY MOUTH DAILY. SWALLOW WHOLE. DO NOT CRUSH, BREAK, OR CHEW TABLETS. TAKE WITH FOOD. 03/14/17  Yes Blanchard Kelchixon, Stephanie N, NP  ALBUTEROL SULFATE IN Inhale into the lungs.    [provider]  azithromycin (ZITHROMAX) 600 MG tablet Take 1,200 mg by mouth once a week.    [provider]    Family History Family History  Problem Relation Age of Onset  . Hypertension Mother   . Hypertension Father   . Heart attack Father     Social History Social History   Tobacco Use  . Smoking status: Former Smoker    Types: Cigarettes    Last attempt to quit: 08/04/2015    Years since quitting: 1.6  . Smokeless tobacco: Never Used  Substance Use Topics  . Alcohol use: No  . Drug use: No     Allergies   Ginger; Bactrim [sulfamethoxazole-trimethoprim]; and Sulfa antibiotics   Review of Systems Review of Systems  Constitutional: Negative for chills and fever.  HENT: Positive for congestion and postnasal drip. Negative for ear pain, facial swelling, rhinorrhea, sinus pain and sore throat.   Eyes: Negative for pain and visual disturbance.  Respiratory: Positive for shortness of breath. Negative for cough and wheezing.   Cardiovascular: Positive for chest pain. Negative for palpitations and leg swelling.    Gastrointestinal: Negative for abdominal pain, constipation, diarrhea, nausea and vomiting.  Genitourinary: Negative for dysuria, flank pain, frequency, hematuria, pelvic pain and urgency.  Musculoskeletal: Negative for arthralgias and back pain.  Skin: Negative for color change and rash.  Neurological: Positive for headaches (frontal, consistent w/ chronic HAs, gradual onset). Negative for seizures, syncope, speech difficulty and weakness.  All other systems reviewed and are negative.    Physical Exam Updated Vital Signs BP (!) 144/89   Pulse 97   Temp 97.9 F (36.6 C) (Oral)   Resp 18   Ht 5\' 4"  (1.626 m)   Wt (!) 257.2 kg (567 lb)   LMP 04/03/2017   SpO2 98%   BMI 97.33 kg/m   Physical Exam  Constitutional: She is oriented to person, place, and time. She appears well-developed and well-nourished. No distress.  HENT:  Head: Normocephalic and atraumatic.  Mouth/Throat: Oropharynx is clear and moist.  Airway patent, no lip or tongue swelling, no pharyngeal erythema/exudate, no tonsillar swelling  Eyes: Conjunctivae and EOM are normal. Pupils are equal, round, and reactive to light.  Neck: Normal range of motion. Neck supple.  Cardiovascular: Normal rate, regular rhythm, normal heart sounds and intact distal pulses.  No murmur heard. Pulmonary/Chest: Effort normal and breath sounds normal. No stridor. No tachypnea. No respiratory distress. She has no decreased breath sounds. She has no wheezes. She has no rhonchi. She has no rales. She exhibits no tenderness (midline).  Abdominal: Soft. Bowel sounds are normal. She exhibits no distension. There is no tenderness. There is no rebound and no guarding.  Morbidly obese  Musculoskeletal: She exhibits no edema.       Right lower leg: Normal. She exhibits no tenderness and no edema.       Left lower leg: Normal. She exhibits no tenderness and no edema.  Lymphadenopathy:    She has no cervical adenopathy.  Neurological: She is alert  and oriented to person, place, and time. No cranial nerve deficit.  Normal strength to BUE and BLE. No pronator drift. No facial droop or slurred speech.  Skin: Skin is warm and dry. Capillary refill takes less than 2 seconds. No erythema.  No discoloration to hands  Psychiatric: Her mood appears anxious.  Nursing note and vitals reviewed.    ED Treatments / Results  Labs (all labs ordered are listed, but only abnormal results are displayed) Labs Reviewed  BASIC METABOLIC PANEL - Abnormal; Notable for the following components:      Result Value   Sodium 134 (*)    Potassium 3.4 (*)    Chloride 99 (*)    Glucose, Bld 142 (*)    Calcium 8.7 (*)    All other components within normal limits  CBC  D-DIMER, QUANTITATIVE (NOT AT [facility])  BRAIN NATRIURETIC PEPTIDE  I-STAT TROPONIN, ED  I-STAT BETA HCG BLOOD, ED ([facility], [facility], [facility] ONLY)  I-STAT TROPONIN, ED    EKG  EKG Interpretation  Date/Time:  Saturday April 14 2017 12:12:30 EST Ventricular Rate:  103 PR Interval:  136 QRS Duration: 84 QT Interval:  336 QTC Calculation: 440 R Axis:   75 Text Interpretation:  Sinus tachycardia Otherwise normal ECG New S1Q3T3 when compared to prior.  No STEMI Confirmed by Theda Belfast ([id number]) on 04/14/2017 12:48:45 PM       Radiology Dg Chest 1 View  Result Date: 04/14/2017 CLINICAL DATA:  Bronchitis EXAM: CHEST 1 VIEW COMPARISON:  None. FINDINGS: Heart and mediastinal contours are within normal limits. No focal opacities or effusions. No acute bony abnormality. IMPRESSION: No active disease. Electronically Signed   By: Charlett Nose M.D.   On: 04/14/2017 13:14   Ct Angio Chest Pe W And/or Wo Contrast  Result Date: 04/14/2017 CLINICAL DATA:  Worsening shortness of breath. Recently treated for bronchitis. EXAM: CT ANGIOGRAPHY CHEST WITH CONTRAST TECHNIQUE: Multidetector CT imaging of the chest was performed using the standard protocol during bolus administration of intravenous contrast. Multiplanar  CT image reconstructions and MIPs were obtained to evaluate the vascular anatomy. CONTRAST:  ISOVUE-370 IOPAMIDOL (ISOVUE-370) INJECTION 76% COMPARISON:  None. FINDINGS: Cardiovascular: The pulmonary arteries are suboptimally opacified and suboptimally visualized due to photon starvation as a result of the large size of the patient. No gross pulmonary artery filling defects are seen. Borderline enlarged heart. Mediastinum/Nodes: No enlarged mediastinal, hilar, or axillary lymph nodes. Thyroid  gland, trachea, and esophagus demonstrate no significant findings. Lungs/Pleura: Mild patchy interstitial prominence in both lungs. No pleural fluid. Upper Abdomen: Limited due to photon starvation with no gross abnormalities seen. Musculoskeletal: Thoracic spine degenerative changes and sternomanubrial joint degenerative changes. Review of the MIP images confirms the above findings. IMPRESSION: 1. Limited examination due to the large size of the patient with no gross pulmonary emboli seen. 2. Mild patchy interstitial prominence in both lungs. This could be due to interstitial pulmonary edema or inflammation. Electronically Signed   By: Beckie Salts M.D.   On: 04/14/2017 16:33    Procedures Procedures (including critical care time)  Medications Ordered in ED Medications  iopamidol (ISOVUE-370) 76 % injection (100 mLs  Contrast Given 04/14/17 1600)  albuterol (PROVENTIL HFA;VENTOLIN HFA) 108 (90 Base) MCG/ACT inhaler 2 puff (2 puffs Inhalation Given 04/14/17 1655)  dexamethasone (DECADRON) injection 10 mg (10 mg Intramuscular Given 04/14/17 1729)     Initial Impression / Assessment and Plan / ED Course  I have reviewed the triage vital signs and the nursing notes.  Pertinent labs & imaging results that were available during my care of the patient were reviewed by me and considered in my medical decision making (see chart for details).   Discussed pt presentation and exam with Dr. Rush Landmark. He evaluated the  pt and agrees with the workup.  Pt ambulated and O2 sat monitored. Maintained O2 sats at 98% on RA with ambulation.   Reviewed pt imaging and labs with Dr. Rush Landmark and he agrees that the CTA imaging is more suggestive of pulmonary inflammation rather than pulmonary edema. Since pt does not appear to be fluid overloaded and does not have an elevated BNP, this is more likely inflammation from possible pneumonitis. He advised giving the pt and albuterol inhaler here as well as a shot of decadron and discharging her with close f/u with her ID doctor.   Rechecked patient.  She continues to exhibit no signs of respiratory distress.  She is satting at 100% on room air.  Her heart rate is in the 80s.  Repeat lung exam reveals no crackles, wheezing, or abnormal lung sounds.  She has good air movement throughout.  She is speaking in full sentences.  Discussed the results of the labs, EKG, imaging and the plan for discharge.  Advised patient to follow-up with her ID doctor within 3 days.  Advised her to stop lisinopril and follow-up with her PCP for further recommendations about her blood pressure medications.  Strict return precautions given.  All questions were answered.   Final Clinical Impressions(s) / ED Diagnoses   Final diagnoses:  SOB (shortness of breath)   37 y/o F presenting to the ED with c/o SOB and CP.   Pulmonary exam was benign and pt had no evidence of LE edema on exam. CXR negative for PNA or PTA. No cardiomegaly noted. BNP negative. BMP&CBC essentially negative. Trop x2 negative.  EKG with no ST elevations or depressions. Did show new S1Q3T3 so D-dimer was obtained and was negative. However, in setting of pt's complaints CTA ordered. No evidence of PE on CTA, but it was suggestive of pulmonary inflammation vs pulmonary edema. Because Dr. Rush Landmark and my exam did not show evidence of fluid overload, lung sounds clear, and BNP negative, we believe pts sxs are likely related to pneumonitis. Tx  with albuterol and decadron and was advised to have close f/u. Strict return precautions given.  Patient had no evidence of angioedema on exam and  she denies c/o lip swelling or tongue swelling. Had patent airway, and was satting in high 90s throughout visit and maintained sats with ambulation. We advised to discontinue her lisinopril and follow-up with her PCP for further recommendations regarding her blood pressure management.    ED Discharge Orders    None      Rayne Du 04/14/17 2222  Tegeler, Canary Brim, MD 04/15/17 [id number]

## 2017-04-14 NOTE — Discharge Instructions (Signed)
Please follow up with your infectious disease doctor within 3 days. Please stop the lisinopril and follow up with your primary care doctor about further treatment of your blood pressure within the next 3 days as well. Return to the ER immediately if you experience any persistent shortness of breath, persistent chest pain, inability to swallow liquids, throat swelling, leg swelling, or any new or worsening symptoms.

## 2017-04-14 NOTE — ED Notes (Signed)
Patient transported to X-ray 

## 2017-04-14 NOTE — ED Notes (Signed)
O2 prior to ambulation- 95% O2 sat during ambulation- 98%

## 2017-04-16 ENCOUNTER — Other Ambulatory Visit: Payer: Self-pay | Admitting: *Deleted

## 2017-04-16 DIAGNOSIS — I10 Essential (primary) hypertension: Secondary | ICD-10-CM

## 2017-04-16 LAB — HIV-1 RNA QUANT-NO REFLEX-BLD
HIV 1 RNA Quant: 43 copies/mL — ABNORMAL HIGH
HIV-1 RNA Quant, Log: 1.63 Log copies/mL — ABNORMAL HIGH

## 2017-04-16 MED ORDER — AMLODIPINE BESYLATE 5 MG PO TABS: 5.0000 mg | ORAL_TABLET | Freq: Every day | ORAL | 2 refills | Status: DC

## 2017-04-16 NOTE — Progress Notes (Signed)
Called patient.  Per verbal order from Rexene AlbertsStephanie Dixon, sent prescription of 5mg  amlodipine 1 tablet by mouth per day, #30 refill 2 to [facility] on [address]. Patient has been scheduled for an echocardiogram scheduled for 1/22. She will follow up with Judeth CornfieldStephanie on 2/4 at 9:45. Andree CossHowell, Amerika Nourse M, RN

## 2017-04-17 ENCOUNTER — Telehealth: Payer: Self-pay | Admitting: *Deleted

## 2017-04-17 NOTE — Telephone Encounter (Signed)
Started Albuterol inhaler after ED visit 04/14/17.  States that she may have over used the Albuterol inhaler yesterday.  Now experiencing increased pulse, "blueness around her mouth/fingertips and some numbness around her mouth."  She is asking whether she needs to be seen for this.  The patient stated that she has not started the "new" medication for her blood pressure yet.  RN advised that she use the Albuterol inhaler as it was prescribed, every 4-6 hours.  Advised that the symptoms she is experiencing may be caused by her over use of the Albuterol inhaler.  RN advised the patient to rest today, drink a coffee cup of water every hour to increase hydration.  RN requested that the patient call her back this afternoon to check up on how she is feeling.  RN spoke with S.Durwin Noraixon, NP regarding this phone call.  S. Dixon agreed that the over use of the Albuterol inhaler may be causing the patient's symptoms.  Also, the prednisone that the patient is currently taking could contribute to the symptoms.

## 2017-04-17 NOTE — Telephone Encounter (Signed)
Thank you Angelique BlonderDenise for helping her through this!

## 2017-04-17 NOTE — Telephone Encounter (Signed)
Patient stated that she is improving.  She is able to cough up sputum now.  She is breathing easier and feels that she does not need to use the Albuterol inhaler.  She is planning to keep the Echocardiogram appointment next week.  She shared that she called her [facility] Case Manager who taught her over the phone some "breathing exercises."  RN offered the patient that if she wanted to call back tomorrow morning to share how she was feeling that she could.

## 2017-04-18 ENCOUNTER — Encounter (HOSPITAL_COMMUNITY): Payer: Self-pay

## 2017-04-18 ENCOUNTER — Emergency Department (HOSPITAL_COMMUNITY): Payer: Self-pay

## 2017-04-18 ENCOUNTER — Telehealth: Payer: Self-pay | Admitting: *Deleted

## 2017-04-18 ENCOUNTER — Other Ambulatory Visit: Payer: Self-pay

## 2017-04-18 ENCOUNTER — Emergency Department (HOSPITAL_COMMUNITY)
Admission: EM | Admit: 2017-04-18 | Discharge: 2017-04-18 | Disposition: A | Discharge status: Home / Self Care | Payer: Self-pay | Attending: Emergency Medicine | Admitting: Emergency Medicine

## 2017-04-18 DIAGNOSIS — Z87891 Personal history of nicotine dependence: Secondary | ICD-10-CM | POA: Insufficient documentation

## 2017-04-18 DIAGNOSIS — Z79899 Other long term (current) drug therapy: Secondary | ICD-10-CM | POA: Insufficient documentation

## 2017-04-18 DIAGNOSIS — R0602 Shortness of breath: Secondary | ICD-10-CM

## 2017-04-18 LAB — BASIC METABOLIC PANEL
ANION GAP: 12 (ref 5–15)
BUN: 11 mg/dL (ref 6–20)
CALCIUM: 8.8 mg/dL — AB (ref 8.9–10.3)
CO2: 24 mmol/L (ref 22–32)
Chloride: 98 mmol/L — ABNORMAL LOW (ref 101–111)
Creatinine, Ser: 0.74 mg/dL (ref 0.44–1.00)
GFR calc Af Amer: >60 mL/min (ref >60)
GFR calc non Af Amer: >60 mL/min (ref >60)
GLUCOSE: 128 mg/dL — AB (ref 65–99)
Potassium: 3.9 mmol/L (ref 3.5–5.1)
Sodium: 134 mmol/L — ABNORMAL LOW (ref 135–145)

## 2017-04-18 LAB — I-STAT ARTERIAL BLOOD GAS, ED
BICARBONATE: 24.5 mmol/L (ref 20.0–28.0)
O2 Saturation: 98 %
PCO2 ART: 36.8 mmHg (ref 32.0–48.0)
PH ART: 7.431 (ref 7.350–7.450)
PO2 ART: 95 mmHg (ref 83.0–108.0)
TCO2: 26 mmol/L (ref 22–32)

## 2017-04-18 LAB — CBC
HCT: 41.4 % (ref 36.0–46.0)
HEMOGLOBIN: 13.3 g/dL (ref 12.0–15.0)
MCH: 30 pg (ref 26.0–34.0)
MCHC: 32.1 g/dL (ref 30.0–36.0)
MCV: 93.2 fL (ref 78.0–100.0)
Platelets: 328 10*3/uL (ref 150–400)
RBC: 4.44 MIL/uL (ref 3.87–5.11)
RDW: 14.9 % (ref 11.5–15.5)
WBC: 6.9 10*3/uL (ref 4.0–10.5)

## 2017-04-18 LAB — I-STAT BETA HCG BLOOD, ED (MC, WL, AP ONLY): I-stat hCG, quantitative: <5 m[IU]/mL (ref <5)

## 2017-04-18 LAB — I-STAT TROPONIN, ED: TROPONIN I, POC: 0 ng/mL (ref 0.00–0.08)

## 2017-04-18 MED ORDER — FUROSEMIDE 20 MG PO TABS: 20.0000 mg | ORAL_TABLET | Freq: Every day | ORAL | 0 refills | Status: AC

## 2017-04-18 MED ORDER — FUROSEMIDE 20 MG PO TABS
20.0000 mg | ORAL_TABLET | Freq: Once | ORAL | Status: AC
Start: 2017-04-18 — End: 2017-04-18
  Administered 2017-04-18: 20 mg via ORAL
  Filled 2017-04-18: qty 1

## 2017-04-18 NOTE — Telephone Encounter (Signed)
ED visit last night for SOB.  Patient states that she feels her heart beating in her chest and she is breathing fast.  She stated that she was concerned about being told that her heart was enlarged.  RN reminded the patient that S. Dixon, NP is working with her on this problem and the Echocardiogram scheduled for next week will give information about this problem.  RN offered the patient an appointment with Vergia AlbertsSherry Royster counselor to talk about her concerns.  Appointment made for tomorrow morning, 04/19/17, with Vergia AlbertsSherry Royster.  RN will route this message to Con-waySherry Royster.  The patient is aware that Ms Anderson MaltaRoyster is receiving this information.

## 2017-04-18 NOTE — Telephone Encounter (Signed)
Appointment made for 04/19/17 at 1030.

## 2017-04-18 NOTE — Telephone Encounter (Signed)
Thank you. I will call her this afternoon and also let her know we will get her set up with an internal medicine clinic for further management. Sounds like she is having some sleep apnea as well.

## 2017-04-18 NOTE — ED Triage Notes (Signed)
Pt states that she has been more SOB lately, some CP, central, non radiating, states she also feels weak, recent diagnoses of bronchitis

## 2017-04-18 NOTE — Discharge Instructions (Signed)
You were seen today for worsening shortness of breath.  You have had a full workup including CT scan, chest x-ray, EKGs, heart test.  You need follow-up with her primary physician.  I suspect you may have an element of sleep apnea.  Your CT scan several days ago showed may be some fluid.  You will be started on daily Lasix for 4 days.  Follow-up with your primary doctor for pulmonology referral.

## 2017-04-18 NOTE — ED Provider Notes (Signed)
[facility] EMERGENCY DEPARTMENT Provider Note   CSN: [id number] Arrival date & time: 04/18/17  0210     History   Chief Complaint Chief Complaint  Patient presents with  . Shortness of Breath    HPI Sara Osborne is a 37 y.o. female.  HPI  This is a 37 year old female with a history of HIV, morbid obesity who presents with persistently worsening shortness of breath.  She has had multiple ED evaluations over the last week.  She states that she continues to have shortness of breath.  She does report cough.  No fevers.  Shortness of breath was worse with laying flat.  She also describes dozing off and trying to sleep but waking up herself up because she is not breathing.  She reports anterior chest pain that is nonradiating.  No appreciable lower extremity swelling.  Chart reviewed.  Last evaluated on 04/14/17.  At that time she had a full workup including a CT scan.  CT scan was negative for PE.  Showed some interstitial infiltrate versus edema.  She has followed up with her primary doctor.  She has been set up for an echocardiogram as an outpatient.  On previous visit she had been treated for bronchitis with nebulizer and steroids.  Past Medical History:  Diagnosis Date  . Abnormal Pap smear of cervix 11/05/2009   LGSIL/ASCUS with colposcopy/biopsy 12/2009  . Allergic rhinitis   . 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 Completed tx.   Marland Kitchen HIV (human immunodeficiency virus infection) (HCC) 10/03/2016  . Knee pain, bilateral 11/06/2016  . Morbid obesity (HCC)    514 lb 2014  . Seasonal allergies 10/03/2016    Patient Active Problem List   Diagnosis Date Noted  . Cardiomegaly 04/12/2017  . Frequent headaches 03/20/2017  . Hypertension 03/20/2017  . Viral URI 03/20/2017  . Chest pain 01/15/2017  . Knee pain, bilateral 11/06/2016  . Healthcare maintenance 11/06/2016  . AIDS (acquired immune  deficiency syndrome) (HCC) 10/03/2016  . Obesity, morbid, BMI 50 or higher (HCC) 10/03/2016  . Seasonal allergies 10/03/2016    Past Surgical History:  Procedure Laterality Date  . CESAREAN SECTION  2003    OB History    Gravida Para Term Preterm AB Living   2             SAB TAB Ectopic Multiple Live Births                   Home Medications    Prior to Admission medications   Medication Sig Start Date End Date Taking? Authorizing Provider  acetaminophen (TYLENOL) 500 MG tablet Take 500-1,000 mg by mouth every 6 (six) hours as needed for mild pain.    [provider]  ALBUTEROL SULFATE IN Inhale into the lungs.    [provider]  amLODipine (NORVASC) 5 MG tablet Take 1 tablet (5 mg total) by mouth daily. 04/16/17   Blanchard Kelch, NP  aspirin 325 MG tablet Take 650 mg by mouth every 6 (six) hours as needed for mild pain.    [provider]  aspirin-sod bicarb-citric acid (ALKA-SELTZER) 325 MG TBEF tablet Take 650 mg by mouth every 6 (six) hours as needed (Cold and congestion).    [provider]  azithromycin (ZITHROMAX) 600 MG tablet Take 1,200 mg by mouth once a week.    [provider]  CHLORPHENIRAMINE-DM PO Take 1 tablet by mouth as  needed (Cold and Flu symptoms).    [provider]  chlorpheniramine-HYDROcodone (TUSSIONEX PENNKINETIC ER) 10-8 MG/5ML SUER Take 5 mLs by mouth at bedtime as needed for cough. 03/20/17   Blanchard Kelchixon, Stephanie N, NP  dapsone 100 MG tablet TAKE 1 TABLET BY MOUTH DAILY 04/10/17   Blanchard Kelchixon, Stephanie N, NP  DESCOVY 200-25 MG tablet TAKE 1 TABLET BY MOUTH DAILY 03/14/17   Blanchard Kelchixon, Stephanie N, NP  furosemide (LASIX) 20 MG tablet Take 1 tablet (20 mg total) by mouth daily. 04/18/17   Karuna Balducci, Mayer Maskerourtney F, MD  lisinopril-hydrochlorothiazide (PRINZIDE,ZESTORETIC) 10-12.5 MG tablet Take 1 tablet by mouth daily. 04/12/17   Blanchard Kelchixon, Stephanie N, NP  predniSONE (DELTASONE) 20 MG tablet Take 2 tablets (40 mg total) by  mouth daily with breakfast. For the next four days 04/10/17   Gerhard MunchLockwood, Robert, MD  PREZCOBIX 800-150 MG tablet TAKE 1 TABLET BY MOUTH DAILY. SWALLOW WHOLE. DO NOT CRUSH, BREAK, OR CHEW TABLETS. TAKE WITH FOOD. 03/14/17   Blanchard Kelchixon, Stephanie N, NP    Family History Family History  Problem Relation Age of Onset  . Hypertension Mother   . Hypertension Father   . Heart attack Father     Social History Social History   Tobacco Use  . Smoking status: Former Smoker    Types: Cigarettes    Last attempt to quit: 08/04/2015    Years since quitting: 1.7  . Smokeless tobacco: Never Used  Substance Use Topics  . Alcohol use: No  . Drug use: No     Allergies   Ginger; Bactrim [sulfamethoxazole-trimethoprim]; and Sulfa antibiotics   Review of Systems Review of Systems  Constitutional: Negative for fever.  Respiratory: Positive for cough and shortness of breath.   Cardiovascular: Positive for chest pain. Negative for leg swelling.  Gastrointestinal: Negative for abdominal pain, nausea and vomiting.  Genitourinary: Negative for dysuria.  All other systems reviewed and are negative.    Physical Exam Updated Vital Signs BP (!) 156/101   Pulse (!) 105   Temp 98.6 F (37 C)   Resp (!) 24   LMP 04/03/2017   SpO2 100%   Physical Exam  Constitutional: She is oriented to person, place, and time. She appears well-developed and well-nourished.  Morbidly obese, no acute distress  HENT:  Head: Normocephalic and atraumatic.  Cardiovascular: Regular rhythm and normal heart sounds.  Tachycardia  Pulmonary/Chest: Effort normal. No respiratory distress. She has no wheezes.  Distant breath sounds, limited by body habitus  Abdominal: Soft. Bowel sounds are normal.  Musculoskeletal:       Right lower leg: She exhibits no edema.       Left lower leg: She exhibits no edema.  Neurological: She is alert and oriented to person, place, and time.  Skin: Skin is warm and dry.  Psychiatric: She has a  normal mood and affect.  Nursing note and vitals reviewed.    ED Treatments / Results  Labs (all labs ordered are listed, but only abnormal results are displayed) Labs Reviewed  BASIC METABOLIC PANEL - Abnormal; Notable for the following components:      Result Value   Sodium 134 (*)    Chloride 98 (*)    Glucose, Bld 128 (*)    Calcium 8.8 (*)    All other components within normal limits  CBC  I-STAT TROPONIN, ED  I-STAT BETA HCG BLOOD, ED ([facility], [facility], [facility] ONLY)  I-STAT ARTERIAL BLOOD GAS, ED    EKG  EKG Interpretation  Date/Time:  Wednesday  April 18 2017 02:15:58 EST Ventricular Rate:  105 PR Interval:  138 QRS Duration: 80 QT Interval:  338 QTC Calculation: 446 R Axis:   82 Text Interpretation:  Sinus tachycardia Otherwise normal ECG Confirmed by Ross Marcus ([id number]) on 04/18/2017 4:44:46 AM       Radiology Dg Chest 2 View  Result Date: 04/18/2017 CLINICAL DATA:  Dyspnea EXAM: CHEST  2 VIEW COMPARISON:  04/14/2017 chest radiograph. FINDINGS: Stable cardiomediastinal silhouette with normal heart size. No pneumothorax. No pleural effusion. Lungs appear clear, with no acute consolidative airspace disease and no pulmonary edema. IMPRESSION: No active cardiopulmonary disease. Electronically Signed   By: Delbert Phenix M.D.   On: 04/18/2017 02:47    Procedures Procedures (including critical care time)  Medications Ordered in ED Medications  furosemide (LASIX) tablet 20 mg (not administered)     Initial Impression / Assessment and Plan / ED Course  I have reviewed the triage vital signs and the nursing notes.  Pertinent labs & imaging results that were available during my care of the patient were reviewed by me and considered in my medical decision making (see chart for details).     Patient presents with persistent shortness of breath.  She is nontoxic on exam.  Vital signs only notable for mild tachycardia at 105.  She is satting 100% on room air.  No acute  distress.  Chest x-ray is essentially negative.  EKG shows no signs of ischemia.  Initial troponin is negative.  She recently was worked up for blood clot with a negative CT scan 3 days ago.  Agree that she likely needs outpatient evaluation with echocardiogram.  She may have some mild fluid that is not appreciable on exam secondary to body habitus.  Will trial with a short course of Lasix to see if this improves her shortness of breath.  I also suspect given her body habitus, she likely has undiagnosed sleep apnea.  Recommend close follow-up with her primary physician and referral to pulmonology for a sleep study.  I discussed this at length with the patient.  After history, exam, and medical workup I feel the patient has been appropriately medically screened and is safe for discharge home. Pertinent diagnoses were discussed with the patient. Patient was given return precautions.   Final Clinical Impressions(s) / ED Diagnoses   Final diagnoses:  SOB (shortness of breath)    ED Discharge Orders        Ordered    furosemide (LASIX) 20 MG tablet  Daily     04/18/17 0533       Shon Baton, MD 04/18/17 [phone]

## 2017-04-19 ENCOUNTER — Other Ambulatory Visit: Payer: Self-pay

## 2017-04-19 ENCOUNTER — Encounter (HOSPITAL_COMMUNITY): Payer: Self-pay | Admitting: Emergency Medicine

## 2017-04-19 ENCOUNTER — Ambulatory Visit (INDEPENDENT_AMBULATORY_CARE_PROVIDER_SITE_OTHER): Payer: Self-pay | Admitting: Licensed Clinical Social Worker

## 2017-04-19 ENCOUNTER — Emergency Department (HOSPITAL_COMMUNITY): Payer: Self-pay

## 2017-04-19 ENCOUNTER — Emergency Department (HOSPITAL_COMMUNITY)
Admission: EM | Admit: 2017-04-19 | Discharge: 2017-04-19 | Disposition: A | Discharge status: Home / Self Care | Payer: Self-pay | Attending: Emergency Medicine | Admitting: Emergency Medicine

## 2017-04-19 DIAGNOSIS — Z7982 Long term (current) use of aspirin: Secondary | ICD-10-CM | POA: Insufficient documentation

## 2017-04-19 DIAGNOSIS — J069 Acute upper respiratory infection, unspecified: Secondary | ICD-10-CM | POA: Insufficient documentation

## 2017-04-19 DIAGNOSIS — Z79899 Other long term (current) drug therapy: Secondary | ICD-10-CM | POA: Insufficient documentation

## 2017-04-19 DIAGNOSIS — R0602 Shortness of breath: Secondary | ICD-10-CM

## 2017-04-19 DIAGNOSIS — Z87891 Personal history of nicotine dependence: Secondary | ICD-10-CM | POA: Insufficient documentation

## 2017-04-19 DIAGNOSIS — F4521 Hypochondriasis: Secondary | ICD-10-CM

## 2017-04-19 DIAGNOSIS — Z21 Asymptomatic human immunodeficiency virus [HIV] infection status: Secondary | ICD-10-CM | POA: Insufficient documentation

## 2017-04-19 DIAGNOSIS — F419 Anxiety disorder, unspecified: Secondary | ICD-10-CM | POA: Insufficient documentation

## 2017-04-19 LAB — I-STAT BETA HCG BLOOD, ED (MC, WL, AP ONLY): I-stat hCG, quantitative: <5 m[IU]/mL (ref <5)

## 2017-04-19 LAB — CBC
HEMATOCRIT: 39.6 % (ref 36.0–46.0)
Hemoglobin: 12.6 g/dL (ref 12.0–15.0)
MCH: 29.4 pg (ref 26.0–34.0)
MCHC: 31.8 g/dL (ref 30.0–36.0)
MCV: 92.5 fL (ref 78.0–100.0)
Platelets: 294 10*3/uL (ref 150–400)
RBC: 4.28 MIL/uL (ref 3.87–5.11)
RDW: 14.8 % (ref 11.5–15.5)
WBC: 5.1 10*3/uL (ref 4.0–10.5)

## 2017-04-19 LAB — BASIC METABOLIC PANEL
Anion gap: 12 (ref 5–15)
BUN: 5 mg/dL — AB (ref 6–20)
CHLORIDE: 98 mmol/L — AB (ref 101–111)
CO2: 23 mmol/L (ref 22–32)
Calcium: 8.7 mg/dL — ABNORMAL LOW (ref 8.9–10.3)
Creatinine, Ser: 0.63 mg/dL (ref 0.44–1.00)
GFR calc Af Amer: >60 mL/min (ref >60)
GFR calc non Af Amer: >60 mL/min (ref >60)
GLUCOSE: 117 mg/dL — AB (ref 65–99)
POTASSIUM: 3.7 mmol/L (ref 3.5–5.1)
Sodium: 133 mmol/L — ABNORMAL LOW (ref 135–145)

## 2017-04-19 LAB — I-STAT TROPONIN, ED: Troponin i, poc: 0 ng/mL (ref 0.00–0.08)

## 2017-04-19 MED ORDER — LORAZEPAM 2 MG/ML IJ SOLN: 1.0000 mg | Freq: Once | INTRAMUSCULAR | Status: DC

## 2017-04-19 MED ORDER — LORAZEPAM 2 MG/ML IJ SOLN
2.0000 mg | Freq: Once | INTRAMUSCULAR | Status: AC
  Administered 2017-04-19: 2 mg via INTRAMUSCULAR
  Filled 2017-04-19: qty 1

## 2017-04-19 MED ORDER — ALPRAZOLAM 0.5 MG PO TABS: 0.5000 mg | ORAL_TABLET | Freq: Three times a day (TID) | ORAL | 0 refills | Status: DC | PRN

## 2017-04-19 MED ORDER — AZITHROMYCIN 250 MG PO TABS: 500.0000 mg | ORAL_TABLET | Freq: Once | ORAL | Status: DC

## 2017-04-19 MED ORDER — AZITHROMYCIN 250 MG PO TABS: 250.0000 mg | ORAL_TABLET | Freq: Every day | ORAL | 0 refills | Status: DC

## 2017-04-19 NOTE — Discharge Instructions (Signed)
Please follow with your primary care doctor in the next 2 days for a check-up. They must obtain records for further management.  ° °Do not hesitate to return to the Emergency Department for any new, worsening or concerning symptoms.  ° °

## 2017-04-19 NOTE — ED Triage Notes (Signed)
Patient presents to the ED from home with complaints of Shortness of Breathe. Patient reports she was seen yesterday send home with RX for lasix. Patient reports she has been taking it last does was 0300. Patient reports she just wants to be taken care of.

## 2017-04-19 NOTE — ED Provider Notes (Signed)
[facility] EMERGENCY DEPARTMENT Provider Note   CSN: [id number] Arrival date & time: 04/19/17  0735     History   Chief Complaint Chief Complaint  Patient presents with  . Shortness of Breath  . Chest Pain    HPI   Blood pressure (!) 159/84, pulse 96, temperature 97.9 F (36.6 C), temperature source Oral, resp. rate (!) 22, last menstrual period 04/03/2017, SpO2 99 %.  Sara Osborne is a 37 y.o. female with past medical history significant for morbid obesity, HIV (last CD4 count 100) complaining of shortness of breath, chest pain, she states that when she picks her nose that bleeds, she states that the symptoms are consistent with prior visits but she is very nervous, she states that she is worried that if she falls asleep she will not wake up, she is never had a history of anxiety.  She feels like she is developing anxiety and panic at this time.  She denies fevers, chills she endorses dry cough.  Compliant with her medications.  Chart review shows that this patient has been seen multiple times over the course of the last week she had a negative PE workup.  She has been prescribed inhaler and steroids for bronchitis, a small dose of Lasix for some mild presumed pulmonary edema.  She has an appointment with a counselor this morning at 10:30 AM.  She has an appointment with her primary care next week.  Mild rhinorrhea and left-sided with no headache, neck stiffness, change in vision.  Past Medical History:  Diagnosis Date  . Abnormal Pap smear of cervix 11/05/2009   LGSIL/ASCUS with colposcopy/biopsy 12/2009  . Allergic rhinitis   . 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 Completed tx.   Marland Kitchen HIV (human immunodeficiency virus infection) (HCC) 10/03/2016  . Knee pain, bilateral 11/06/2016  . Morbid obesity (HCC)    514 lb 2014  . Seasonal allergies 10/03/2016    Patient Active Problem List   Diagnosis Date Noted  . Cardiomegaly 04/12/2017  . Frequent headaches 03/20/2017  . Hypertension 03/20/2017  . Viral URI 03/20/2017  . Chest pain 01/15/2017  . Knee pain, bilateral 11/06/2016  . Healthcare maintenance 11/06/2016  . AIDS (acquired immune deficiency syndrome) (HCC) 10/03/2016  . Obesity, morbid, BMI 50 or higher (HCC) 10/03/2016  . Seasonal allergies 10/03/2016    Past Surgical History:  Procedure Laterality Date  . CESAREAN SECTION  2003    OB History    Gravida Para Term Preterm AB Living   2             SAB TAB Ectopic Multiple Live Births                   Home Medications    Prior to Admission medications   Medication Sig Start Date End Date Taking? Authorizing Provider  acetaminophen (TYLENOL) 500 MG tablet Take 500-1,000 mg by mouth every 6 (six) hours as needed for mild pain.    [provider]  ALBUTEROL SULFATE IN Inhale into the lungs.    [provider]  ALPRAZolam Prudy Feeler) 0.5 MG tablet Take 1 tablet (0.5 mg total) by mouth 3 (three) times daily as needed for anxiety. 04/19/17   Yoland Scherr, Joni Reining, PA-C  amLODipine (NORVASC) 5 MG tablet Take 1 tablet (5 mg total) by mouth daily. 04/16/17   Blanchard Kelch, NP  aspirin 325 MG tablet Take 650 mg by mouth every 6 (  six) hours as needed for mild pain.    [provider]  aspirin-sod bicarb-citric acid (ALKA-SELTZER) 325 MG TBEF tablet Take 650 mg by mouth every 6 (six) hours as needed (Cold and congestion).    [provider]  azithromycin (ZITHROMAX Z-PAK) 250 MG tablet Take 1 tablet (250 mg total) by mouth daily. 500mg  PO day 1, then 250mg  PO days 205 04/19/17   Brynnly Bonet, Joni Reining, PA-C  azithromycin (ZITHROMAX) 600 MG tablet Take 1,200 mg by mouth once a week.    [provider]  CHLORPHENIRAMINE-DM PO Take 1 tablet by mouth as needed (Cold and Flu symptoms).    [provider]  chlorpheniramine-HYDROcodone (TUSSIONEX PENNKINETIC ER) 10-8 MG/5ML SUER  Take 5 mLs by mouth at bedtime as needed for cough. 03/20/17   Blanchard Kelch, NP  dapsone 100 MG tablet TAKE 1 TABLET BY MOUTH DAILY 04/10/17   Blanchard Kelch, NP  DESCOVY 200-25 MG tablet TAKE 1 TABLET BY MOUTH DAILY 03/14/17   Blanchard Kelch, NP  furosemide (LASIX) 20 MG tablet Take 1 tablet (20 mg total) by mouth daily. 04/18/17   Horton, Mayer Masker, MD  lisinopril-hydrochlorothiazide (PRINZIDE,ZESTORETIC) 10-12.5 MG tablet Take 1 tablet by mouth daily. 04/12/17   Blanchard Kelch, NP  predniSONE (DELTASONE) 20 MG tablet Take 2 tablets (40 mg total) by mouth daily with breakfast. For the next four days 04/10/17   Gerhard Munch, MD  PREZCOBIX 800-150 MG tablet TAKE 1 TABLET BY MOUTH DAILY. SWALLOW WHOLE. DO NOT CRUSH, BREAK, OR CHEW TABLETS. TAKE WITH FOOD. 03/14/17   Blanchard Kelch, NP    Family History Family History  Problem Relation Age of Onset  . Hypertension Mother   . Hypertension Father   . Heart attack Father     Social History Social History   Tobacco Use  . Smoking status: Former Smoker    Types: Cigarettes    Last attempt to quit: 08/04/2015    Years since quitting: 1.7  . Smokeless tobacco: Never Used  Substance Use Topics  . Alcohol use: No  . Drug use: No     Allergies   Ginger; Bactrim [sulfamethoxazole-trimethoprim]; and Sulfa antibiotics   Review of Systems Review of Systems  A complete review of systems was obtained and all systems are negative except as noted in the HPI and PMH.   Physical Exam Updated Vital Signs BP (!) 159/84 (BP Location: Right Wrist)   Pulse 96   Temp 97.9 F (36.6 C) (Oral)   Resp (!) 22   LMP 04/03/2017   SpO2 99%   Physical Exam  Constitutional: She is oriented to person, place, and time. She appears well-developed and well-nourished. No distress.  HENT:  Head: Normocephalic.  Right Ear: External ear normal.  Left Ear: External ear normal.  Mouth/Throat: Oropharynx is clear and moist. No  oropharyngeal exudate.  No drooling or stridor. Posterior pharynx mildly erythematous no significant tonsillar hypertrophy. No exudate. Soft palate rises symmetrically. No TTP or induration under tongue.   No tenderness to palpation of frontal or bilateral maxillary sinuses.  Mild mucosal edema in the nares with scant rhinorrhea.  Bilateral tympanic membranes with normal architecture and good light reflex.    Eyes: Conjunctivae and EOM are normal. Pupils are equal, round, and reactive to light.  Neck: Normal range of motion. Neck supple. No JVD present. No tracheal deviation present.  Cardiovascular: Normal rate, regular rhythm and intact distal pulses.  Radial pulse equal bilaterally  Pulmonary/Chest: Effort normal  and breath sounds normal. No stridor. No respiratory distress. She has no wheezes. She has no rales. She exhibits no tenderness.  Abdominal: Soft. She exhibits no distension and no mass. There is no tenderness. There is no rebound and no guarding.  Musculoskeletal: Normal range of motion. She exhibits no edema or tenderness.  No calf asymmetry, superficial collaterals, palpable cords, edema, Homans sign negative bilaterally.    Neurological: She is alert and oriented to person, place, and time.  Skin: Skin is warm. She is not diaphoretic.  Psychiatric: She has a normal mood and affect.  Nursing note and vitals reviewed.    ED Treatments / Results  Labs (all labs ordered are listed, but only abnormal results are displayed) Labs Reviewed  BASIC METABOLIC PANEL - Abnormal; Notable for the following components:      Result Value   Sodium 133 (*)    Chloride 98 (*)    Glucose, Bld 117 (*)    BUN 5 (*)    Calcium 8.7 (*)    All other components within normal limits  CBC  I-STAT TROPONIN, ED  I-STAT BETA HCG BLOOD, ED ([facility], [facility], [facility] ONLY)    EKG  EKG Interpretation  Date/Time:  Thursday April 19 2017 08:29:14 EST Ventricular Rate:  101 PR Interval:    QRS  Duration: 88 QT Interval:  342 QTC Calculation: 444 R Axis:   71 Text Interpretation:  Sinus tachycardia Borderline T wave abnormalities No significant change was found Confirmed by Azalia Bilisampos, Kevin ([id number]) on 04/19/2017 9:38:45 AM       Radiology Dg Chest 2 View  Result Date: 04/19/2017 CLINICAL DATA:  Shortness of breath. EXAM: CHEST  2 VIEW COMPARISON:  Radiographs of April 18, 2017. FINDINGS: The heart size and mediastinal contours are within normal limits. Both lungs are clear. No pneumothorax or pleural effusion is noted. The visualized skeletal structures are unremarkable. IMPRESSION: No active cardiopulmonary disease. Electronically Signed   By: Lupita RaiderJames  Green Jr, M.D.   On: 04/19/2017 08:41   Dg Chest 2 View  Result Date: 04/18/2017 CLINICAL DATA:  Dyspnea EXAM: CHEST  2 VIEW COMPARISON:  04/14/2017 chest radiograph. FINDINGS: Stable cardiomediastinal silhouette with normal heart size. No pneumothorax. No pleural effusion. Lungs appear clear, with no acute consolidative airspace disease and no pulmonary edema. IMPRESSION: No active cardiopulmonary disease. Electronically Signed   By: Delbert PhenixJason A Poff M.D.   On: 04/18/2017 02:47    Procedures Procedures (including critical care time)  Medications Ordered in ED Medications  LORazepam (ATIVAN) injection 2 mg (2 mg Intramuscular Given 04/19/17 0855)     Initial Impression / Assessment and Plan / ED Course  I have reviewed the triage vital signs and the nursing notes.  Pertinent labs & imaging results that were available during my care of the patient were reviewed by me and considered in my medical decision making (see chart for details).     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 female presenting with  persistent shortness of breath and chest pain, this patient has had multiple visits to the ED over the course of the last several days, vital signs reassuring, she saturating well on room air, lung sounds are  clear with good air movement.  Chest x-ray without infiltrate.  She has been worked up for a PE and had a negative CTA within the last week.  EKG with no acute findings, troponin negative, blood work otherwise unremarkable.  There is an element of anxiety, patient will be given Ativan, will start on Z-Pak for upper respiratory infection, of advised patient to maintain her counselor appointment at the infectious disease clinic and she is to go directly there.  Discussed with attending physician who agrees with care plan.  Evaluation does not show pathology that would require ongoing emergent intervention or inpatient treatment. Pt is hemodynamically stable and mentating appropriately. Discussed findings and plan with patient/guardian, who agrees with care plan. All questions answered. Return precautions discussed and outpatient follow up given.      Final Clinical Impressions(s) / ED Diagnoses   Final diagnoses:  Shortness of breath  Anxiety  Upper respiratory tract infection, unspecified type    ED Discharge Orders        Ordered    ALPRAZolam (XANAX) 0.5 MG tablet  3 times daily PRN     04/19/17 0947    azithromycin (ZITHROMAX Z-PAK) 250 MG tablet  Daily     04/19/17 0953       Edwardine Deschepper, Mardella Layman 04/19/17 0957    Azalia Bilis, MD 04/19/17 1649

## 2017-04-20 ENCOUNTER — Emergency Department (HOSPITAL_COMMUNITY): Payer: Self-pay

## 2017-04-20 ENCOUNTER — Encounter: Payer: Self-pay | Admitting: Infectious Diseases

## 2017-04-20 ENCOUNTER — Telehealth: Payer: Self-pay | Admitting: Infectious Diseases

## 2017-04-20 ENCOUNTER — Emergency Department (HOSPITAL_COMMUNITY)
Admission: EM | Admit: 2017-04-20 | Discharge: 2017-04-20 | Disposition: A | Discharge status: Home / Self Care | Payer: Self-pay | Attending: Emergency Medicine | Admitting: Emergency Medicine

## 2017-04-20 ENCOUNTER — Encounter (HOSPITAL_COMMUNITY): Payer: Self-pay | Admitting: Emergency Medicine

## 2017-04-20 ENCOUNTER — Other Ambulatory Visit: Payer: Self-pay

## 2017-04-20 ENCOUNTER — Encounter (HOSPITAL_COMMUNITY): Payer: Self-pay | Admitting: Obstetrics and Gynecology

## 2017-04-20 ENCOUNTER — Emergency Department (HOSPITAL_COMMUNITY)
Admission: EM | Admit: 2017-04-20 | Discharge: 2017-04-21 | Disposition: A | Discharge status: Home / Self Care | Payer: Self-pay | Attending: Emergency Medicine | Admitting: Emergency Medicine

## 2017-04-20 DIAGNOSIS — R51 Headache: Secondary | ICD-10-CM | POA: Insufficient documentation

## 2017-04-20 DIAGNOSIS — R0602 Shortness of breath: Secondary | ICD-10-CM | POA: Insufficient documentation

## 2017-04-20 DIAGNOSIS — Z7982 Long term (current) use of aspirin: Secondary | ICD-10-CM | POA: Insufficient documentation

## 2017-04-20 DIAGNOSIS — Z9189 Other specified personal risk factors, not elsewhere classified: Secondary | ICD-10-CM

## 2017-04-20 DIAGNOSIS — R519 Headache, unspecified: Secondary | ICD-10-CM

## 2017-04-20 DIAGNOSIS — Z0000 Encounter for general adult medical examination without abnormal findings: Secondary | ICD-10-CM

## 2017-04-20 DIAGNOSIS — Z21 Asymptomatic human immunodeficiency virus [HIV] infection status: Secondary | ICD-10-CM | POA: Insufficient documentation

## 2017-04-20 DIAGNOSIS — Z79899 Other long term (current) drug therapy: Secondary | ICD-10-CM | POA: Insufficient documentation

## 2017-04-20 DIAGNOSIS — Z87891 Personal history of nicotine dependence: Secondary | ICD-10-CM | POA: Insufficient documentation

## 2017-04-20 LAB — CBC WITH DIFFERENTIAL/PLATELET
Basophils Absolute: 0 10*3/uL (ref 0.0–0.1)
Basophils Relative: 0 %
EOS PCT: 1 %
Eosinophils Absolute: 0.1 10*3/uL (ref 0.0–0.7)
HCT: 39.5 % (ref 36.0–46.0)
Hemoglobin: 12.8 g/dL (ref 12.0–15.0)
LYMPHS PCT: 36 %
Lymphs Abs: 2.1 10*3/uL (ref 0.7–4.0)
MCH: 29.8 pg (ref 26.0–34.0)
MCHC: 32.4 g/dL (ref 30.0–36.0)
MCV: 91.9 fL (ref 78.0–100.0)
MONO ABS: 0.8 10*3/uL (ref 0.1–1.0)
Monocytes Relative: 13 %
Neutro Abs: 3 10*3/uL (ref 1.7–7.7)
Neutrophils Relative %: 50 %
PLATELETS: 280 10*3/uL (ref 150–400)
RBC: 4.3 MIL/uL (ref 3.87–5.11)
RDW: 15 % (ref 11.5–15.5)
WBC: 5.9 10*3/uL (ref 4.0–10.5)

## 2017-04-20 LAB — BASIC METABOLIC PANEL
Anion gap: 10 (ref 5–15)
BUN: 6 mg/dL (ref 6–20)
CHLORIDE: 98 mmol/L — AB (ref 101–111)
CO2: 26 mmol/L (ref 22–32)
CREATININE: 0.66 mg/dL (ref 0.44–1.00)
Calcium: 8.8 mg/dL — ABNORMAL LOW (ref 8.9–10.3)
GFR calc Af Amer: >60 mL/min (ref >60)
GFR calc non Af Amer: >60 mL/min (ref >60)
GLUCOSE: 117 mg/dL — AB (ref 65–99)
Potassium: 3.8 mmol/L (ref 3.5–5.1)
Sodium: 134 mmol/L — ABNORMAL LOW (ref 135–145)

## 2017-04-20 LAB — I-STAT BETA HCG BLOOD, ED (MC, WL, AP ONLY): I-stat hCG, quantitative: <5 m[IU]/mL (ref <5)

## 2017-04-20 LAB — I-STAT TROPONIN, ED: Troponin i, poc: 0 ng/mL (ref 0.00–0.08)

## 2017-04-20 LAB — BRAIN NATRIURETIC PEPTIDE: B NATRIURETIC PEPTIDE 5: 14.7 pg/mL (ref 0.0–100.0)

## 2017-04-20 MED ORDER — FUROSEMIDE 10 MG/ML IJ SOLN
40.0000 mg | Freq: Once | INTRAMUSCULAR | Status: AC
  Administered 2017-04-20: 40 mg via INTRAVENOUS
  Filled 2017-04-20: qty 4

## 2017-04-20 MED ORDER — ACETAMINOPHEN 500 MG PO TABS
1000.0000 mg | ORAL_TABLET | Freq: Once | ORAL | Status: AC
  Administered 2017-04-20: 1000 mg via ORAL
  Filled 2017-04-20: qty 2

## 2017-04-20 MED ORDER — FAMOTIDINE 20 MG PO TABS
20.0000 mg | ORAL_TABLET | Freq: Once | ORAL | Status: AC
  Administered 2017-04-20: 20 mg via ORAL
  Filled 2017-04-20: qty 1

## 2017-04-20 NOTE — ED Notes (Signed)
Pt is continuing to call the nurse's station with anxiety and numerous complates. Charge [facility], [facility], and PA are all aware of patient's situation. PA has re evaluated pt ever after being discharged. Pt showing this RN that she had received discharge instructions prior to 1900 when this RN arrived and this RN has reviewed discharge instructions with pt and pt verbalizes understanding. Pt has also put her sister on speaker phone with this RN and was asking multiple questions about pt's need for oxygen at home and possible CPAP machine at home. It was explained to family/patient that these concerns are important for her to follow-up with her PCP.

## 2017-04-20 NOTE — Discharge Instructions (Signed)
Please follow up with your doctor and cardiologist regarding your ED visit and symptoms today.  If you need further emergency medical evaluations then please consider going to [place].

## 2017-04-20 NOTE — Discharge Instructions (Signed)
Your labs, EKG, chest x-ray today were normal.  Please follow-up with your primary care physician as they will be able to set you up for a sleep study as an outpatient.  This is not done in the hospital.  At this time we do not see any reason for admission to the hospital or condition that we could treat while in the hospital.  Your oxygen level in the emergency department was normal at rest and with walking.

## 2017-04-20 NOTE — ED Notes (Signed)
Ambulating in room with pulse ox.sats remain 98%.

## 2017-04-20 NOTE — ED Triage Notes (Signed)
Patient reports SOB with persistent productive cough with chest congestion/chest tightness onset last week.

## 2017-04-20 NOTE — ED Notes (Signed)
Pt calling out to nurses station saying that she is SOB. RN enters room to assess pt and finds pt sitting on side of bed, deep breathing, Lung ausculation revealed clear breath sounds bilaterally diminished in bases. O2 sats on room air at 99%. Pt complaining that her throat is closing up because it feels dry. Attempted to reassure pt that she is stable. Pt verbalizes that she is anxious and needs help. PA notified and agrees to see pt at bedside at this time.

## 2017-04-20 NOTE — ED Notes (Signed)
Pt alert and ambulatory to bathroom with assistance.

## 2017-04-20 NOTE — ED Notes (Signed)
Pt with complaints of indigestion at this time. MD notified. Orders given and implemented.

## 2017-04-20 NOTE — ED Triage Notes (Signed)
Per EMS: Pt is coming from home. Pt was discharged from [facility] this morning. Pt was given lasix and "told she had fluid in her lungs" which has been a recurrent issue the last couple of weeks.  Pt does not have a diagnosis of CHF but is supposed to followup for an echo.  Lung sounds are clear with EMS. If pt lays down or leans too far forward she has Sob. Pt has dyspnea with exertion.  Pt also has anxiety.  Pt has a hx of chronic bronchitis, cardiomegaly, hypertension, ad HIV.  Pt's O2: 98% on RA BP 144/100 ST 100 HR RR 20 SPCO 6

## 2017-04-20 NOTE — ED Notes (Signed)
BMP specimen hemolyzed, order placed for redraw per standing orders.

## 2017-04-20 NOTE — ED Provider Notes (Signed)
TIME SEEN: 5:20 AM  CHIEF COMPLAINT: Shortness of breath, chest pain  HPI: Patient is a 37 year old morbidly obese female with history of HIV who presents emergency department shortness of breath.  Symptoms have been ongoing for 2-3 weeks.  Has had productive cough that she states is slightly bloody with yellow sputum.  Reports sharp chest pain with this shortness of breath.  She has been seen January 7, January 12, January 16 and January 17 for the same.  Had a CTA of her chest on January 12 that did not show any large pulmonary embolus but was limited due to patient's morbid obesity.  She has been on prednisone and albuterol.  States that she was also started on lisinopril recently for hypertension.  She states that she thinks that she has sleep apnea.  She does not have a CPAP at home.  ROS: See HPI Constitutional: no fever  Eyes: no drainage  ENT: no runny nose   Cardiovascular:   chest pain  Resp:  SOB  GI: no vomiting GU: no dysuria Integumentary: no rash  Allergy: no hives  Musculoskeletal: no leg swelling  Neurological: no slurred speech ROS otherwise negative  PAST MEDICAL HISTORY/PAST SURGICAL HISTORY:  Past Medical History:  Diagnosis Date  . Abnormal Pap smear of cervix 11/05/2009   LGSIL/ASCUS with colposcopy/biopsy 12/2009  . Allergic rhinitis   . 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 Completed tx.   Marland Kitchen HIV (human immunodeficiency virus infection) (HCC) 10/03/2016  . Knee pain, bilateral 11/06/2016  . Morbid obesity (HCC)    514 lb 2014  . Seasonal allergies 10/03/2016    MEDICATIONS:  Prior to Admission medications   Medication Sig Start Date End Date Taking? Authorizing Provider  acetaminophen (TYLENOL) 500 MG tablet Take 500-1,000 mg by mouth every 6 (six) hours as needed for mild pain.    [provider]  ALBUTEROL SULFATE IN Inhale into the lungs.    [provider]   ALPRAZolam Prudy Feeler) 0.5 MG tablet Take 1 tablet (0.5 mg total) by mouth 3 (three) times daily as needed for anxiety. 04/19/17   Pisciotta, Joni Reining, PA-C  amLODipine (NORVASC) 5 MG tablet Take 1 tablet (5 mg total) by mouth daily. 04/16/17   Blanchard Kelch, NP  aspirin 325 MG tablet Take 650 mg by mouth every 6 (six) hours as needed for mild pain.    [provider]  aspirin-sod bicarb-citric acid (ALKA-SELTZER) 325 MG TBEF tablet Take 650 mg by mouth every 6 (six) hours as needed (Cold and congestion).    [provider]  azithromycin (ZITHROMAX Z-PAK) 250 MG tablet Take 1 tablet (250 mg total) by mouth daily. 500mg  PO day 1, then 250mg  PO days 205 04/19/17   Pisciotta, Joni Reining, PA-C  azithromycin (ZITHROMAX) 600 MG tablet Take 1,200 mg by mouth once a week.    [provider]  CHLORPHENIRAMINE-DM PO Take 1 tablet by mouth as needed (Cold and Flu symptoms).    [provider]  chlorpheniramine-HYDROcodone (TUSSIONEX PENNKINETIC ER) 10-8 MG/5ML SUER Take 5 mLs by mouth at bedtime as needed for cough. 03/20/17   Blanchard Kelch, NP  dapsone 100 MG tablet TAKE 1 TABLET BY MOUTH DAILY 04/10/17   Blanchard Kelch, NP  DESCOVY 200-25 MG tablet TAKE 1 TABLET BY MOUTH DAILY 03/14/17   Blanchard Kelch, NP  furosemide (LASIX) 20 MG tablet Take 1 tablet (20 mg total) by mouth daily. 04/18/17  Horton, Mayer Masker, MD  lisinopril-hydrochlorothiazide (PRINZIDE,ZESTORETIC) 10-12.5 MG tablet Take 1 tablet by mouth daily. 04/12/17   Blanchard Kelch, NP  predniSONE (DELTASONE) 20 MG tablet Take 2 tablets (40 mg total) by mouth daily with breakfast. For the next four days 04/10/17   Gerhard Munch, MD  PREZCOBIX 800-150 MG tablet TAKE 1 TABLET BY MOUTH DAILY. SWALLOW WHOLE. DO NOT CRUSH, BREAK, OR CHEW TABLETS. TAKE WITH FOOD. 03/14/17   Blanchard Kelch, NP    ALLERGIES:  Allergies  Allergen Reactions  . Ginger Hives  . Bactrim [Sulfamethoxazole-Trimethoprim] Rash     Fever  . Sulfa Antibiotics Rash    SOCIAL HISTORY:  Social History   Tobacco Use  . Smoking status: Former Smoker    Types: Cigarettes    Last attempt to quit: 08/04/2015    Years since quitting: 1.7  . Smokeless tobacco: Never Used  Substance Use Topics  . Alcohol use: No    FAMILY HISTORY: Family History  Problem Relation Age of Onset  . Hypertension Mother   . Hypertension Father   . Heart attack Father     EXAM: BP 128/90 (BP Location: Right Arm)   Pulse (!) 104   Temp 98.2 F (36.8 C) (Temporal)   Resp 16   LMP 04/03/2017   SpO2 100%  CONSTITUTIONAL: Alert and oriented and responds appropriately to questions.  Morbidly obese HEAD: Normocephalic EYES: Conjunctivae clear, pupils appear equal, EOMI ENT: normal nose; moist mucous membranes NECK: Supple, no meningismus, no nuchal rigidity, no LAD  CARD: Regular and tachycardic; S1 and S2 appreciated; no murmurs, no clicks, no rubs, no gallops RESP: Normal chest excursion without splinting or tachypnea; breath sounds clear and equal bilaterally; no wheezes, no rhonchi, no rales, no hypoxia or respiratory distress, speaking full sentences ABD/GI: Normal bowel sounds; non-distended; soft, non-tender, no rebound, no guarding, no peritoneal signs, no hepatosplenomegaly BACK:  The back appears normal and is non-tender to palpation, there is no CVA tenderness EXT: Normal ROM in all joints; non-tender to palpation; no edema; normal capillary refill; no cyanosis, no calf tenderness or swelling    SKIN: Normal color for age and race; warm; no rash NEURO: Moves all extremities equally PSYCH: The patient's mood and manner are appropriate. Grooming and personal hygiene are appropriate.  MEDICAL DECISION MAKING: Patient here with chest pain shortness of breath that have been ongoing for weeks.  Reports cough but no fever.  No history of CHF but did show mild pulmonary edema on CT scan.  Has been on lisinopril and recent albuterol and  steroids.  States that she feels like the steroids and not helping.  Her lungs are currently clear but will give breathing treatment to provide some symptomatic relief.  Patient is talking rapidly and frequently interrupting this physician and the entire time has sats of 100%.  There is no sign of any respiratory distress today.  She has had a workup to evaluate for PE and has had multiple negative troponins.  She does have history of HIV with a CD4 count on December 18 of 100 and a viral load of 29.  At this time she shows no signs of infection, sepsis.  We will repeat labs including BNP, troponin and chest x-ray.  Patient has demanded admission several time discussed with her that we would work her up further and we could discuss with the hospitalist but could not guarantee admission.  She is requesting a sleep study.  I am instructed her that these  are done as an outpatient.  She states she thinks this is sleep apnea and I agree that that could be contributing to her symptoms especially given her morbid obesity.  ED PROGRESS: Patient's troponin negative.  BNP normal but this could be falsely low and morbidly obese patient.  Chest x-ray shows mild cardiomegaly with possible vascular congestion.  Will give IV Lasix here.  Will ambulate with pulse ox.  Patient's oxygen saturation does not drop below 98% with ambulation.  At this time I do not have any reason to admit her to the hospital.  We have discussed this at length and recommended close follow-up with her PCP.  No sign of pneumothorax, pneumonia, significant pulmonary edema.  A recent negative workup for PE.  This seems very atypical for ACS.  She has had multiple negative troponins at this point.  I feel her shortness of breath is likely due to sleep apnea and her morbid obesity but I do not feel there is anything that requires hospitalization at this time.  She is comfortable with plan for outpatient follow-up.   At this time, I do not feel there is  any life-threatening condition present. I have reviewed and discussed all results (EKG, imaging, lab, urine as appropriate) and exam findings with patient/family. I have reviewed nursing notes and appropriate previous records.  I feel the patient is safe to be discharged home without further emergent workup and can continue workup as an outpatient as needed. Discussed usual and customary return precautions. Patient/family verbalize understanding and are comfortable with this plan.  Outpatient follow-up has been provided if needed. All questions have been answered.    EKG Interpretation  Date/Time:  Friday April 20 2017 05:15:10 EST Ventricular Rate:  113 PR Interval:    QRS Duration: 88 QT Interval:  333 QTC Calculation: 457 R Axis:   49 Text Interpretation:  Sinus tachycardia Borderline T wave abnormalities No significant change since last tracing Confirmed by Karol Skarzynski, Baxter HireKristen [phone]([id number]) on 04/20/2017 5:21:04 AM          Adonnis Salceda, Layla MawKristen N, DO 04/20/17 [id number]

## 2017-04-20 NOTE — ED Provider Notes (Signed)
[facility]-EMERGENCY DEPT Provider Note   CSN: [id number] Arrival date & time: 04/20/17  1258     History   Chief Complaint Chief Complaint  Patient presents with  . Shortness of Breath    HPI Sara Osborne is a 37 y.o. female with a history of AIDS, Morbid obesity (BMI 103.7), and multiple recent ED visits with out admission for chest pain and shortness of breath.  She was initially diagnosed with probable bronchitis in December.  She reports that in December she was also evaluated for headache with CT scan that was normal.  She reports that after she was discharged this morning she had a coughing episode where she coughed up clear and yellow tinged sputum with streaks of red in it.  She reports that then she tried to eat a banana and drink some fluid and had a coughing fit.  She also reports that she has a headache in the front and back of her head.  This is similar to when she had a headache with CT head in December.  The last time she had a headache this bad was this morning at [place].  She reports that normally she takes tylenol for her headaches but has not as she is on steroids.     She has been seen in the office by infectious disease multiple times since her symptoms started.  She was most recently seen in the emergency room today for evaluation of shortness of breath.  Labs were obtained, patient was able to maintain her sats and discharged home.  She was also seen yesterday 04/19/17, and 04/18/17, all for the same complaints.  She has been referred to cardiology as she has possible cardiomegaly.    HPI  Past Medical History:  Diagnosis Date  . Abnormal Pap smear of cervix 11/05/2009   LGSIL/ASCUS with colposcopy/biopsy 12/2009  . Allergic rhinitis   . 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 Completed tx.   Marland Kitchen HIV (human immunodeficiency virus infection) (HCC) 10/03/2016  . Knee  pain, bilateral 11/06/2016  . Morbid obesity (HCC)    514 lb 2014  . Seasonal allergies 10/03/2016    Patient Active Problem List   Diagnosis Date Noted  . Cardiomegaly 04/12/2017  . Frequent headaches 03/20/2017  . Hypertension 03/20/2017  . Viral URI 03/20/2017  . Chest pain 01/15/2017  . Knee pain, bilateral 11/06/2016  . Healthcare maintenance 11/06/2016  . AIDS (acquired immune deficiency syndrome) (HCC) 10/03/2016  . Obesity, morbid, BMI 50 or higher (HCC) 10/03/2016  . Seasonal allergies 10/03/2016    Past Surgical History:  Procedure Laterality Date  . CESAREAN SECTION  2003    OB History    Gravida Para Term Preterm AB Living   2             SAB TAB Ectopic Multiple Live Births                   Home Medications    Prior to Admission medications   Medication Sig Start Date End Date Taking? Authorizing Provider  albuterol (PROVENTIL HFA) 108 (90 Base) MCG/ACT inhaler Inhale 2 puffs into the lungs every 6 (six) hours as needed for wheezing or shortness of breath.   Yes [provider]  aspirin 325 MG tablet Take 650 mg by mouth every 6 (six) hours as needed for mild pain.   Yes [provider]  azithromycin (ZITHROMAX) 600 MG  tablet Take 1,200 mg by mouth once a week.   Yes [provider]  chlorpheniramine-HYDROcodone (TUSSIONEX PENNKINETIC ER) 10-8 MG/5ML SUER Take 5 mLs by mouth at bedtime as needed for cough. 03/20/17  Yes Blanchard Kelchixon, Stephanie N, NP  dapsone 100 MG tablet TAKE 1 TABLET BY MOUTH DAILY 04/10/17  Yes Blanchard Kelchixon, Stephanie N, NP  DESCOVY 200-25 MG tablet TAKE 1 TABLET BY MOUTH DAILY 03/14/17  Yes Blanchard Kelchixon, Stephanie N, NP  furosemide (LASIX) 20 MG tablet Take 1 tablet (20 mg total) by mouth daily. 04/18/17  Yes Horton, Mayer Maskerourtney F, MD  lisinopril-hydrochlorothiazide (PRINZIDE,ZESTORETIC) 10-12.5 MG tablet Take 1 tablet by mouth daily. 04/12/17  Yes Blanchard Kelchixon, Stephanie N, NP  predniSONE (DELTASONE) 20 MG tablet Take 2 tablets (40 mg total) by  mouth daily with breakfast. For the next four days 04/10/17  Yes Gerhard MunchLockwood, Robert, MD  PREZCOBIX 800-150 MG tablet TAKE 1 TABLET BY MOUTH DAILY. SWALLOW WHOLE. DO NOT CRUSH, BREAK, OR CHEW TABLETS. TAKE WITH FOOD. 03/14/17  Yes Blanchard Kelchixon, Stephanie N, NP  ALPRAZolam Prudy Feeler(XANAX) 0.5 MG tablet Take 1 tablet (0.5 mg total) by mouth 3 (three) times daily as needed for anxiety. Patient not taking: Reported on 04/20/2017 04/19/17   Pisciotta, Joni ReiningNicole, PA-C  amLODipine (NORVASC) 5 MG tablet Take 1 tablet (5 mg total) by mouth daily. Patient not taking: Reported on 04/20/2017 04/16/17   Blanchard Kelchixon, Stephanie N, NP  azithromycin (ZITHROMAX Z-PAK) 250 MG tablet Take 1 tablet (250 mg total) by mouth daily. 500mg  PO day 1, then 250mg  PO days 205 Patient not taking: Reported on 04/20/2017 04/19/17   Pisciotta, Joni ReiningNicole, PA-C  CHLORPHENIRAMINE-DM PO Take 1 tablet by mouth as needed (Cold and Flu symptoms).    [provider]    Family History Family History  Problem Relation Age of Onset  . Hypertension Mother   . Hypertension Father   . Heart attack Father     Social History Social History   Tobacco Use  . Smoking status: Former Smoker    Types: Cigarettes    Last attempt to quit: 08/04/2015    Years since quitting: 1.7  . Smokeless tobacco: Never Used  Substance Use Topics  . Alcohol use: No  . Drug use: No     Allergies   Lisinopril; Ginger; Bactrim [sulfamethoxazole-trimethoprim]; and Sulfa antibiotics   Review of Systems Review of Systems  Constitutional: Negative for chills and fever.  HENT: Positive for sinus pain. Negative for ear pain, rhinorrhea and sore throat.   Eyes: Negative for pain and visual disturbance.  Respiratory: Positive for cough, chest tightness and shortness of breath.   Cardiovascular: Negative for chest pain and palpitations.  Gastrointestinal: Negative for abdominal pain, nausea and vomiting.  Genitourinary: Negative for dysuria and hematuria.  Musculoskeletal: Negative  for arthralgias and back pain.  Skin: Negative for color change and rash.  Neurological: Positive for headaches. Negative for seizures and syncope.  All other systems reviewed and are negative.    Physical Exam Updated Vital Signs BP (!) 159/95 (BP Location: Right Arm)   Pulse 100   Temp 98.2 F (36.8 C) (Oral)   Resp (!) 23   Ht 5\' 4"  (1.626 m)   Wt (!) 257.2 kg (567 lb)   LMP 04/03/2017   SpO2 97%   BMI 97.33 kg/m   Physical Exam  Constitutional: She appears well-developed and well-nourished. No distress.  Patient is morbidly obese.    HENT:  Head: Normocephalic and atraumatic.  Mouth/Throat: Oropharynx is clear and moist.  Eyes: Conjunctivae and EOM are normal. Pupils are equal, round, and reactive to light. Right eye exhibits no discharge. Left eye exhibits no discharge. No scleral icterus.  Neck: Normal range of motion.  Cardiovascular: Normal rate and regular rhythm.  Pulmonary/Chest: Effort normal and breath sounds normal. No stridor. No respiratory distress. She has no decreased breath sounds. She has no wheezes. She has no rhonchi. She has no rales.  Abdominal: She exhibits no distension.  Musculoskeletal: She exhibits no edema or deformity.  Neurological: She is alert. She exhibits normal muscle tone. GCS eye subscore is 4. GCS verbal subscore is 5. GCS motor subscore is 6.  Mental Status:  Alert, oriented, thought content appropriate, able to give a coherent history. Speech fluent without evidence of aphasia. Able to follow 2 step commands without difficulty.  Cranial Nerves:  II:  Peripheral visual fields grossly normal, pupils equal, round, reactive to light III,IV, VI: ptosis not present, extra-ocular motions intact bilaterally  V,VII: smile symmetric, facial light touch sensation equal VIII: hearing grossly normal to voice  X: uvula elevates symmetrically  XI: bilateral shoulder shrug symmetric and strong XII: midline tongue extension without  fassiculations Motor:  Normal tone. 5/5 in upper and lower extremities bilaterally including strong and equal grip strength and dorsiflexion/plantar flexion.  Cerebellar: normal finger-to-nose with bilateral upper extremities CV: distal pulses palpable throughout    Skin: Skin is warm and dry. She is not diaphoretic.  Psychiatric: Her behavior is normal. Her mood appears anxious.  Nursing note and vitals reviewed.    ED Treatments / Results  Labs (all labs ordered are listed, but only abnormal results are displayed) Labs Reviewed - No data to display  EKG  EKG Interpretation None       Radiology- Not obtained this ED visit.  Dg Chest 2 View  Result Date: 04/20/2017 CLINICAL DATA:  38 year old female with cough and shortness of breath. EXAM: CHEST  2 VIEW COMPARISON:  Chest radiograph dated 04/19/2017 FINDINGS: There is mild cardiomegaly with possible mild vascular congestion. No focal consolidation, pleural effusion, or pneumothorax. No acute osseous pathology. IMPRESSION: Mild cardiomegaly with possible mild vascular congestion. No focal consolidation. Electronically Signed   By: Elgie Collard M.D.   On: 04/20/2017 06:42   Dg Chest 2 View  Result Date: 04/19/2017 CLINICAL DATA:  Shortness of breath. EXAM: CHEST  2 VIEW COMPARISON:  Radiographs of April 18, 2017. FINDINGS: The heart size and mediastinal contours are within normal limits. Both lungs are clear. No pneumothorax or pleural effusion is noted. The visualized skeletal structures are unremarkable. IMPRESSION: No active cardiopulmonary disease. Electronically Signed   By: Lupita Raider, M.D.   On: 04/19/2017 08:41    Procedures Procedures (including critical care time)  Medications Ordered in ED Medications  acetaminophen (TYLENOL) tablet 1,000 mg (1,000 mg Oral Given 04/20/17 1443)     Initial Impression / Assessment and Plan / ED Course  I have reviewed the triage vital signs and the nursing  notes.  Pertinent labs & imaging results that were available during my care of the patient were reviewed by me and considered in my medical decision making (see chart for details).  Clinical Course as of Apr 21 1599  Fri Apr 20, 2017  1547 Patient reports that her headache is better after tylenol.  She was able to walk with out difficulty, ataxia, or appearing off balance.    [EH]    Clinical Course User Index [EH] Cristina Gong, PA-C   Lanora Manis  Beranek presents today for evaluation of shortness of breath.  She was seen for this   Earlier this morning at [facility], along with daily for the past 2 days.  She has had extensive workups including multiple chest x-rays, labs, trending troponin, d-dimer, and CTA PE studies with out cause found.  She is scheduled for an echo this coming week and has been refered to cardiology.  I suspect that her shortness of breath is from her bronchitis with physical deconditioning, anxeity, and body habitus playing a significiant role.  This patient was seen as a shared visit with Dr. Freida Busman who agreed with my plan.  Lung sounds are clear to auscultation bilaterally and she is hemodynamically stable.  I do not suspect PE, pneumonia, pneumothorax.  She has had troponin's trended with out elevations.    At this time there does not appear to be any evidence of an acute emergency medical condition and the patient appears stable for discharge with appropriate outpatient follow up.Diagnosis was discussed with patient who verbalizes understanding and is agreeable to discharge.   Note: Patient did not have a ride home and allowed to "board" in the emergency room after discharge.  I was called by rn multiple times as patient was reporting that her throat felt like it was closing, despite no new exposures, and she was feeling short of breath.  She remained hemodynamically stable.  Lung sounds remained clear to auscultation bilaterally.  I suspect that anxiety is playing a  large role in her symptoms.  She was given 0.5 mg Ativan IM (not p.o. per request).  I still feel that patient is safe for outpatient follow-up at this time.  She was not having obvious tachypnea or increased work of breathing, no stridor, was able to speak in full sentences without difficulty.   Final Clinical Impressions(s) / ED Diagnoses   Final diagnoses:  SOB (shortness of breath)  Nonintractable headache, unspecified chronicity pattern, unspecified headache type    ED Discharge Orders    None       Hildagarde, Holleran, PA-C 04/20/17 1603    Cristina Gong, PA-C 04/21/17 Estrella Myrtle    Lorre Nick, MD 04/21/17 [id number]

## 2017-04-20 NOTE — Telephone Encounter (Addendum)
Returned call to check on Sara Osborne as she has been seen in the ED various times for SOB and chest pain. New symptom of nose bleeds that started yesterday. Feels that her face is swelling and closing up on her. Feels as is if she is drowning from "the top down" and feels the "fluid they see on chest xray dripping down her throat."  Apparently she is taking aspirin routinely vs tylenol for analgesic. I advised her to stop this for now as I also wonder if this is contributing to her SOB. Cautioned about rx sent from ED about xanax 0.5 mg TID in this morbidly obese benzo-naive woman with likely undiagnosed OSA. I do believe she needs something for anxiety however I would prefer to have her take only half a tablet once and see how she does with it before jumping to TID.   We discussed sleep study process and how this is an outpatient procedure. Referral placed.   I also would like for her to establish care with internal medicine, however we will continue to support her in the interim.   Provided as much reassurance as I could over the phone for her today. Advised her to go home and rest in a recliner and try a cool mist humidifier near her to keep nasal passages moist.

## 2017-04-20 NOTE — ED Notes (Signed)
Bed: [id number] Expected date:  Expected time:  Means of arrival:  Comments: 37 yo SOB- Just d/c from [facility]

## 2017-04-20 NOTE — ED Notes (Signed)
Patient transported to X-ray 

## 2017-04-21 DIAGNOSIS — Z87891 Personal history of nicotine dependence: Secondary | ICD-10-CM | POA: Insufficient documentation

## 2017-04-21 DIAGNOSIS — R51 Headache: Secondary | ICD-10-CM | POA: Insufficient documentation

## 2017-04-21 DIAGNOSIS — R202 Paresthesia of skin: Secondary | ICD-10-CM | POA: Insufficient documentation

## 2017-04-21 DIAGNOSIS — B20 Human immunodeficiency virus [HIV] disease: Secondary | ICD-10-CM | POA: Insufficient documentation

## 2017-04-21 DIAGNOSIS — R05 Cough: Secondary | ICD-10-CM | POA: Insufficient documentation

## 2017-04-21 DIAGNOSIS — R109 Unspecified abdominal pain: Secondary | ICD-10-CM | POA: Insufficient documentation

## 2017-04-21 DIAGNOSIS — Z79899 Other long term (current) drug therapy: Secondary | ICD-10-CM | POA: Insufficient documentation

## 2017-04-21 DIAGNOSIS — R079 Chest pain, unspecified: Secondary | ICD-10-CM | POA: Insufficient documentation

## 2017-04-21 DIAGNOSIS — I10 Essential (primary) hypertension: Secondary | ICD-10-CM | POA: Insufficient documentation

## 2017-04-21 DIAGNOSIS — M542 Cervicalgia: Secondary | ICD-10-CM | POA: Insufficient documentation

## 2017-04-21 MED ORDER — LORAZEPAM 2 MG/ML IJ SOLN
0.5000 mg | Freq: Once | INTRAMUSCULAR | Status: AC
  Administered 2017-04-21: 0.5 mg via INTRAMUSCULAR
  Filled 2017-04-21: qty 1

## 2017-04-21 NOTE — ED Notes (Signed)
PA has returned to bedside to discuss her anxiety. Pt states that she is unable to swallow d/t her throat being sore. Pt agreeable to IM and then to be transferred to waiting room to await her ride. Attempted to call John GiovanniDushun Alston at [phone] however no answer and no voicemail setup. Pt explained that staff unable to contact her ride. AC aware of POC

## 2017-04-22 ENCOUNTER — Emergency Department (HOSPITAL_COMMUNITY): Payer: Self-pay

## 2017-04-22 ENCOUNTER — Emergency Department (HOSPITAL_COMMUNITY)
Admission: EM | Admit: 2017-04-22 | Discharge: 2017-04-22 | Disposition: A | Discharge status: Home / Self Care | Payer: Self-pay | Attending: Emergency Medicine | Admitting: Emergency Medicine

## 2017-04-22 ENCOUNTER — Other Ambulatory Visit: Payer: Self-pay

## 2017-04-22 ENCOUNTER — Encounter (HOSPITAL_COMMUNITY): Payer: Self-pay | Admitting: *Deleted

## 2017-04-22 DIAGNOSIS — R519 Headache, unspecified: Secondary | ICD-10-CM

## 2017-04-22 DIAGNOSIS — R51 Headache: Secondary | ICD-10-CM

## 2017-04-22 DIAGNOSIS — R202 Paresthesia of skin: Secondary | ICD-10-CM

## 2017-04-22 HISTORY — DX: Essential (primary) hypertension: I10

## 2017-04-22 LAB — CBC
HCT: 41 % (ref 36.0–46.0)
Hemoglobin: 13.1 g/dL (ref 12.0–15.0)
MCH: 29.5 pg (ref 26.0–34.0)
MCHC: 32 g/dL (ref 30.0–36.0)
MCV: 92.3 fL (ref 78.0–100.0)
Platelets: 283 10*3/uL (ref 150–400)
RBC: 4.44 MIL/uL (ref 3.87–5.11)
RDW: 15 % (ref 11.5–15.5)
WBC: 4.9 10*3/uL (ref 4.0–10.5)

## 2017-04-22 LAB — COMPREHENSIVE METABOLIC PANEL
ALK PHOS: 97 U/L (ref 38–126)
ALT: 16 U/L (ref 14–54)
AST: 21 U/L (ref 15–41)
Albumin: 3.8 g/dL (ref 3.5–5.0)
Anion gap: 11 (ref 5–15)
BILIRUBIN TOTAL: 0.8 mg/dL (ref 0.3–1.2)
BUN: 6 mg/dL (ref 6–20)
CALCIUM: 9.1 mg/dL (ref 8.9–10.3)
CO2: 25 mmol/L (ref 22–32)
CREATININE: 0.8 mg/dL (ref 0.44–1.00)
Chloride: 97 mmol/L — ABNORMAL LOW (ref 101–111)
Glucose, Bld: 115 mg/dL — ABNORMAL HIGH (ref 65–99)
Potassium: 3.7 mmol/L (ref 3.5–5.1)
Sodium: 133 mmol/L — ABNORMAL LOW (ref 135–145)
Total Protein: 8.2 g/dL — ABNORMAL HIGH (ref 6.5–8.1)

## 2017-04-22 LAB — URINALYSIS, ROUTINE W REFLEX MICROSCOPIC
Bilirubin Urine: NEGATIVE
Glucose, UA: NEGATIVE mg/dL
Hgb urine dipstick: NEGATIVE
Ketones, ur: 80 mg/dL — AB
Nitrite: NEGATIVE
Protein, ur: 100 mg/dL — AB
Specific Gravity, Urine: 1.031 — ABNORMAL HIGH (ref 1.005–1.030)
pH: 5 (ref 5.0–8.0)

## 2017-04-22 LAB — I-STAT TROPONIN, ED: Troponin i, poc: 0 ng/mL (ref 0.00–0.08)

## 2017-04-22 LAB — CBG MONITORING, ED: Glucose-Capillary: 101 mg/dL — ABNORMAL HIGH (ref 65–99)

## 2017-04-22 LAB — HCG, QUANTITATIVE, PREGNANCY: HCG, BETA CHAIN, QUANT, S: 1 m[IU]/mL (ref <5)

## 2017-04-22 MED ORDER — SODIUM CHLORIDE 0.9 % IV BOLUS (SEPSIS)
1000.0000 mL | Freq: Once | INTRAVENOUS | Status: AC
  Administered 2017-04-22: 1000 mL via INTRAVENOUS

## 2017-04-22 MED ORDER — METOCLOPRAMIDE HCL 5 MG/ML IJ SOLN
10.0000 mg | Freq: Once | INTRAMUSCULAR | Status: AC
  Administered 2017-04-22: 10 mg via INTRAVENOUS
  Filled 2017-04-22: qty 2

## 2017-04-22 MED ORDER — KETOROLAC TROMETHAMINE 30 MG/ML IJ SOLN
30.0000 mg | Freq: Once | INTRAMUSCULAR | Status: AC
  Administered 2017-04-22: 30 mg via INTRAVENOUS
  Filled 2017-04-22: qty 1

## 2017-04-22 NOTE — ED Triage Notes (Signed)
Pt to ED by EMS with multiple complaints. Reports having throbbing sensation to R side of head with pain and numbness to face and throat. Has had difficulty thinking and has not been sleeping very well at home. EMS reports LNW 1700 today, pt reports symptoms have been ongoing for several days.

## 2017-04-22 NOTE — ED Notes (Signed)
Pt brought back to triage for repeat vitals, hyperventilating, c/o cramping in leg and wanting to stand up while also c/o dizziness and feeling faint. After speaking to charge RN, pt placed on stretcher in A hall for high fall risk precautions.

## 2017-04-22 NOTE — ED Notes (Signed)
Pt reports being on steroids for sob, has been having increased anxiety since steroid use

## 2017-04-22 NOTE — Discharge Instructions (Signed)
Make sure to drink plenty of fluids.  You can take ibuprofen or Tylenol as prescribed over-the-counter, as needed for headache.  Try to eat healthy foods, such as fruits and vegetables, and avoid fatty greasy foods.  Try to exercise 3-4 times a week for 30 minutes at a time, even if it is just walking.  Talk to your doctor about helping you create a more specific diet and exercise program.  Please follow-up with your doctor for further evaluation and treatment of your symptoms.  Please follow-up with a neurologist (2 options below) for further evaluation of your probable migraine headaches.  Please return to the emergency department if you develop any new or worsening symptoms.

## 2017-04-22 NOTE — ED Provider Notes (Signed)
[facility] EMERGENCY DEPARTMENT Provider Note   CSN: [id number] Arrival date & time: 04/21/17  2359     History   Chief Complaint Chief Complaint  Patient presents with  . Numbness    HPI Sara Osborne is a 37 y.o. female with history of morbid obesity, disseminated Mycobacterium avium-intracellulare complex, HIV, hypertension who presents with a 2-week history of intermittent headache and associated tingling around her mouth and hands that started while she was waiting in the emergency department today.  She describes her headache as right temporal and behind her right eye.  Comes in waves.  She reports pain in her neck when her head hurts, however has full range of motion without  pain.  She has history of headaches, however she states her headaches have never lasted this long.  Patient has been evaluated several times in the past week for cough she is also had for 2 weeks.  She has had decreased appetite and fluid intake.  She has had a few episodes of vomiting, but none in 2 days.  She denies any diarrhea.  She has had some intermittent right flank pain, however none at this time.  She reports intermittent chest pain that is worse with breathing at times.  She denies any shortness of breath at this time, however she has been evaluated for shortness of breath 6 times in the past 2 weeks.  She denies any recent long trips over 8 hours, surgeries, known cancer, new leg pain or swelling, exogenous estrogen use, history of blood clots.  HPI  Past Medical History:  Diagnosis Date  . Abnormal Pap smear of cervix 11/05/2009   LGSIL/ASCUS with colposcopy/biopsy 12/2009  . Allergic rhinitis   . 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 Completed tx.   Marland Kitchen HIV (human immunodeficiency virus infection) (HCC) 10/03/2016  . Hypertension   . Knee pain, bilateral 11/06/2016  . Morbid obesity (HCC)    514 lb 2014    . Seasonal allergies 10/03/2016    Patient Active Problem List   Diagnosis Date Noted  . Cardiomegaly 04/12/2017  . Frequent headaches 03/20/2017  . Hypertension 03/20/2017  . Viral URI 03/20/2017  . Chest pain 01/15/2017  . Knee pain, bilateral 11/06/2016  . Healthcare maintenance 11/06/2016  . AIDS (acquired immune deficiency syndrome) (HCC) 10/03/2016  . Obesity, morbid, BMI 50 or higher (HCC) 10/03/2016  . Seasonal allergies 10/03/2016    Past Surgical History:  Procedure Laterality Date  . CESAREAN SECTION  2003    OB History    Gravida Para Term Preterm AB Living   2             SAB TAB Ectopic Multiple Live Births                   Home Medications    Prior to Admission medications   Medication Sig Start Date End Date Taking? Authorizing Provider  acetaminophen (TYLENOL) 500 MG tablet Take 1,000 mg by mouth every 6 (six) hours as needed for mild pain.   Yes [provider]  albuterol (PROVENTIL HFA) 108 (90 Base) MCG/ACT inhaler Inhale 2 puffs into the lungs every 6 (six) hours as needed for wheezing or shortness of breath.   Yes [provider]  aspirin 325 MG tablet Take 650 mg by mouth every 6 (six) hours as needed for mild pain.   Yes [provider]  azithromycin (ZITHROMAX) 600 MG  tablet Take 1,200 mg by mouth once a week.   Yes [provider]  chlorpheniramine-HYDROcodone (TUSSIONEX PENNKINETIC ER) 10-8 MG/5ML SUER Take 5 mLs by mouth at bedtime as needed for cough. 03/20/17  Yes Blanchard Kelchixon, Stephanie N, NP  dapsone 100 MG tablet TAKE 1 TABLET BY MOUTH DAILY 04/10/17  Yes Blanchard Kelchixon, Stephanie N, NP  DESCOVY 200-25 MG tablet TAKE 1 TABLET BY MOUTH DAILY 03/14/17  Yes Blanchard Kelchixon, Stephanie N, NP  furosemide (LASIX) 20 MG tablet Take 1 tablet (20 mg total) by mouth daily. 04/18/17  Yes Horton, Mayer Maskerourtney F, MD  PREZCOBIX 800-150 MG tablet TAKE 1 TABLET BY MOUTH DAILY. SWALLOW WHOLE. DO NOT CRUSH, BREAK, OR CHEW TABLETS. TAKE WITH FOOD. 03/14/17   Yes Blanchard Kelchixon, Stephanie N, NP  ALPRAZolam Prudy Feeler(XANAX) 0.5 MG tablet Take 1 tablet (0.5 mg total) by mouth 3 (three) times daily as needed for anxiety. Patient not taking: Reported on 04/20/2017 04/19/17   Pisciotta, Joni ReiningNicole, PA-C  amLODipine (NORVASC) 5 MG tablet Take 1 tablet (5 mg total) by mouth daily. Patient not taking: Reported on 04/20/2017 04/16/17   Blanchard Kelchixon, Stephanie N, NP  azithromycin (ZITHROMAX Z-PAK) 250 MG tablet Take 1 tablet (250 mg total) by mouth daily. 500mg  PO day 1, then 250mg  PO days 205 Patient not taking: Reported on 04/20/2017 04/19/17   Pisciotta, Joni ReiningNicole, PA-C  lisinopril-hydrochlorothiazide (PRINZIDE,ZESTORETIC) 10-12.5 MG tablet Take 1 tablet by mouth daily. Patient not taking: Reported on 04/22/2017 04/12/17   Blanchard Kelchixon, Stephanie N, NP  predniSONE (DELTASONE) 20 MG tablet Take 2 tablets (40 mg total) by mouth daily with breakfast. For the next four days Patient not taking: Reported on 04/22/2017 04/10/17   Gerhard MunchLockwood, Robert, MD    Family History Family History  Problem Relation Age of Onset  . Hypertension Mother   . Hypertension Father   . Heart attack Father     Social History Social History   Tobacco Use  . Smoking status: Former Smoker    Types: Cigarettes    Last attempt to quit: 08/04/2015    Years since quitting: 1.7  . Smokeless tobacco: Never Used  Substance Use Topics  . Alcohol use: No  . Drug use: No     Allergies   Lisinopril; Ginger; Bactrim [sulfamethoxazole-trimethoprim]; and Sulfa antibiotics   Review of Systems Review of Systems  Constitutional: Negative for chills and fever.  HENT: Positive for ear pain (R). Negative for facial swelling and sore throat.   Respiratory: Positive for cough and shortness of breath.   Cardiovascular: Positive for chest pain.  Gastrointestinal: Positive for abdominal pain, nausea and vomiting. Negative for diarrhea.  Genitourinary: Positive for decreased urine volume. Negative for dysuria.  Musculoskeletal: Negative for  back pain.  Skin: Negative for rash and wound.  Neurological: Positive for numbness (tingling ) and headaches. Negative for weakness.  Psychiatric/Behavioral: The patient is not nervous/anxious.      Physical Exam Updated Vital Signs BP (!) 154/96 (BP Location: Right Arm)   Pulse (!) 109   Temp 98.9 F (37.2 C) (Oral)   Resp (!) 23   LMP 04/03/2017   SpO2 98%   Physical Exam  Constitutional: She appears well-developed and well-nourished. No distress.  Morbidly obese  HENT:  Head: Normocephalic and atraumatic.  Right Ear: Tympanic membrane normal.  Left Ear: Tympanic membrane normal.  Mouth/Throat: Oropharynx is clear and moist. No oropharyngeal exudate.  Eyes: Conjunctivae are normal. Pupils are equal, round, and reactive to light. Right eye exhibits no discharge. Left eye exhibits no discharge.  No scleral icterus.  Neck: Normal range of motion. Neck supple. No thyromegaly present.  Cardiovascular: Regular rhythm, normal heart sounds and intact distal pulses. Exam reveals no gallop and no friction rub.  No murmur heard. Pulmonary/Chest: Effort normal and breath sounds normal. No stridor. No respiratory distress. She has no wheezes. She has no rales.  Abdominal: Soft. Bowel sounds are normal. She exhibits no distension. There is no tenderness. There is no rebound, no guarding and no CVA tenderness.  Musculoskeletal: She exhibits no edema.  Lymphadenopathy:    She has no cervical adenopathy.  Neurological: She is alert. Coordination normal.  CN 3-12 intact; normal sensation throughout; 5/5 strength in all 4 extremities; equal bilateral grip strength  Skin: Skin is warm and dry. No rash noted. She is not diaphoretic. No pallor.  Psychiatric: She has a normal mood and affect.  Nursing note and vitals reviewed.    ED Treatments / Results  Labs (all labs ordered are listed, but only abnormal results are displayed) Labs Reviewed  COMPREHENSIVE METABOLIC PANEL - Abnormal;  Notable for the following components:      Result Value   Sodium 133 (*)    Chloride 97 (*)    Glucose, Bld 115 (*)    Total Protein 8.2 (*)    All other components within normal limits  URINALYSIS, ROUTINE W REFLEX MICROSCOPIC - Abnormal; Notable for the following components:   Color, Urine AMBER (*)    APPearance CLOUDY (*)    Specific Gravity, Urine 1.031 (*)    Ketones, ur 80 (*)    Protein, ur 100 (*)    Leukocytes, UA TRACE (*)    Bacteria, UA RARE (*)    Squamous Epithelial / LPF 6-30 (*)    All other components within normal limits  CBG MONITORING, ED - Abnormal; Notable for the following components:   Glucose-Capillary 101 (*)    All other components within normal limits  CBC  HCG, QUANTITATIVE, PREGNANCY  I-STAT TROPONIN, ED    EKG  EKG Interpretation  Date/Time:  Sunday April 22 2017 09:46:15 EST Ventricular Rate:  110 PR Interval:    QRS Duration: 89 QT Interval:  321 QTC Calculation: 435 R Axis:   65 Text Interpretation:  Sinus tachycardia Consider right atrial enlargement Borderline repolarization abnormality Confirmed by Tilden Fossa [phone]) on 04/22/2017 9:51:59 AM       Radiology Ct Head Wo Contrast  Result Date: 04/22/2017 CLINICAL DATA:  38 year old female with history of pressure in throbbing in the head for the past 6 weeks. EXAM: CT HEAD WITHOUT CONTRAST TECHNIQUE: Contiguous axial images were obtained from the base of the skull through the vertex without intravenous contrast. COMPARISON:  Head CT 03/10/2017. FINDINGS: Brain: No evidence of acute infarction, hemorrhage, hydrocephalus, extra-axial collection or mass lesion/mass effect. Vascular: No hyperdense vessel or unexpected calcification. Skull: Normal. Negative for fracture or focal lesion. Sinuses/Orbits: No acute finding. Other: None. IMPRESSION: 1. No acute intracranial abnormalities. The appearance of the brain is normal. Electronically Signed   By: Trudie Reed M.D.   On: 04/22/2017  14:37    Procedures Procedures (including critical care time)  Medications Ordered in ED Medications  sodium chloride 0.9 % bolus 1,000 mL (0 mLs Intravenous Stopped 04/22/17 1152)  ketorolac (TORADOL) 30 MG/ML injection 30 mg (30 mg Intravenous Given 04/22/17 1022)  metoCLOPramide (REGLAN) injection 10 mg (10 mg Intravenous Given 04/22/17 1021)     Initial Impression / Assessment and Plan / ED Course  I have  reviewed the triage vital signs and the nursing notes.  Pertinent labs & imaging results that were available during my care of the patient were reviewed by me and considered in my medical decision making (see chart for details).  Clinical Course as of Apr 23 1527  Wynelle Link Apr 22, 2017  1023 I was called to bedside by nurse to witness patient having an episode that she describes she has had 3 times since she has checked into the emergency department.  She reports and exhibits involuntary head movements where she pops her head towards the right.  She is having trouble speaking during this time.  Suspect complex migraine, however will order head CT.  [AL]    Clinical Course User Index [AL] Emi Holes, PA-C    Patient with multiple complaints and has been evaluated 6-7 times in the past 2 weeks.  She reports today that she has had a headache over the past 2 weeks it has been intermittent.  It has been throbbing her right temporal region.  She has had associated tingling to her face intermittently since she arrived around her mouth as well as tingling to her hands.  These episodes also involves involuntary head movements.  Her headache was completely resolved after Reglan, Toradol, fluid bolus.  She did not have any further episodes of head movements.  CT head is negative.  Labs are unremarkable, except with mild dehydration in the urine.  Suspect complicated migraine.  Patient has follow-up for echocardiogram in 2 days.  Suspect chest pain shortness of breath related to anxiety, as patient  had an episode of tightness in her chest and tingling in her hands after she got stressful news on the phone during her visit today.  Chest x-ray is stable.  Troponin is negative.  D-dimer was negative when it was drawn 1 week ago for the same symptoms.  Will refer to PCP and neurology.  Ibuprofen and Tylenol suggested for headache in the future.  Patient's PCP you would like to hold off on hypertensive medication until after her echo.  Patient vitals stable throughout ED course and discharged in satisfactory condition.  Final Clinical Impressions(s) / ED Diagnoses   Final diagnoses:  Acute nonintractable headache, unspecified headache type  Paresthesia    ED Discharge Orders    None       Emi Holes, PA-C 04/22/17 1530    Tilden Fossa, MD 04/23/17 2046

## 2017-04-22 NOTE — ED Notes (Signed)
Pt given water to drink , pt feeling that water caused her chest to hurt worse. Pt noted to be hyperventilating; encouraged pt to slow breathing down and try to relax. Pt continues to moan and groan, stating "there's something wrong with my body."

## 2017-04-23 NOTE — BH Specialist Note (Signed)
Integrated Behavioral Health Initial Visit  MRN: [id number] Name: Sara Osborne  Number of Integrated Behavioral Health Clinician visits:: 1/6 Session Start time: 11:01 am  Session End time: 11:45 am Total time: 45 minutes  Type of Service: Integrated Behavioral Health- Individual/Family Interpretor:No. Interpretor Name and Language: N/A   SUBJECTIVE: Sara Osborne is a 38 y.o. female accompanied by self Patient was referred by triage for anxiety symptoms and is a patient of Rexene Alberts.  Patient reports the following symptoms/concerns: For the past two weeks patient has experienced new physical symptoms of pronounced heart beat, headaches, and waking up during sleep not breathing.  Patient completed the GAD-7 assessment and scored a 17 evidencing Severe anxiety.  Patient's anxiety about her physical health are causing her to feel nervous, have trouble relaxing, and to feel afraid.  Patient reported that her only coping strategy has been to "try to breathe".  Patient reported that her thoughts are fear of dying and "impending doom" due to her family history of having Congestive Heart Failure and her family members who have died from heart issues.  Patient stated "I think what can I do to correct it" and her fear comes and goes.    Intervention:  [facility] attempted to educate patient on thought stopping techniques and distraction, however patient presented as fixated on her physical symptoms and her past symptoms and treatment.  Patient required multiple redirection away from taking about physical symptoms, when they started, and the severity, and [facility] repeatedly informed the patient that she is not a medical provider and does not have a medical background, and to inform her PCP or [facility] provider of her medical concerns.  Patient was not receptive to the interventions provided to reduce and interrupt anxiety.  Duration of problem: past 2 weeks; Severity of problem: moderate  OBJECTIVE: Mood:  Anxious and Irritable and Affect: Looked in physical pain and as dramatic Risk of harm to self or others: No plan to harm self or others  ASSESSMENT: Patient is currently experiencing excessive preoccupation with her breathing and her heart function and has reported to the ED several times a week with "shortness of breathe" and "headaches".  Patient may benefit from behavioral health services, consultation and education from the medical provider, and psychiatric evaluation services/medication management.  GOALS ADDRESSED: Patient will: 1. Reduce symptoms of: anxiety and preoccupation with health and physical sensations and symptoms 2. Increase knowledge and/or ability of: coping skills, healthy habits and stress reduction  3. Demonstrate ability to: challenge irrational thoughts about health  INTERVENTIONS: Interventions utilized: Copywriter, advertising, Brief CBT and Psychoeducation and/or Health Education  Gave list of relaxation techniques and discussed how to incorporate.  Provided handout on Countering Anxiety and reviewed how to begin challenging irrational thoughts about health and how to develop rational counter statements. Educated and participated in guided visualization by watching two videos online and encouraged to use daily.  Explained the Panic Process with a diagram and reviewed how anxiety and anxious thoughts can fuel and create sensations in the body and how to interrupt the process with thought stopping techniques that counter irrational thoughts.  Facilitated a discussion of irrational thoughts and rational responses to them.  Standardized Assessments completed: GAD-7   GAD 7 : Generalized Anxiety Score 04/19/2017  Nervous, Anxious, on Edge 3  Control/stop worrying 2  Worry too much - different things 2  Trouble relaxing 3  Restless 2  Easily annoyed or irritable 2  Afraid - awful might happen 3  Total  GAD 7 Score 17  Anxiety Difficulty Extremely difficult    Although patient scored high on the GAD, her worry and preoccupation are centered around her health and not on multiple events, situations, or activities, ruling out GAD diagnosis.  PLAN: 1. Patient was provided with a business card, as she was reluctant to schedule another session, due to insistence that her problems are medically-related.  Patient will call to schedule another appointment as needed. 2. Behavioral recommendations: Patient will practice and integrate relaxation and guided visualization techniques into daily life and as needed.  Patient will begin to challenge irrational thoughts and develop rational counter statements.  Patient will incorporate thought stopping techniques into daily life. 3. [facility] will discuss patient symptoms with medical provider and collaborate on treatment, including possible [facility] and provider to provide medical information to challenge irrational thoughts about her health.  Vergia AlbertsSherry Jermarion Poffenberger, [facility]

## 2017-04-24 ENCOUNTER — Ambulatory Visit (HOSPITAL_COMMUNITY)
Admission: RE | Admit: 2017-04-24 | Discharge: 2017-04-24 | Disposition: A | Discharge status: Home / Self Care | Payer: Self-pay | Source: Ambulatory Visit | Attending: Infectious Diseases | Admitting: Infectious Diseases

## 2017-04-24 ENCOUNTER — Encounter (HOSPITAL_COMMUNITY): Payer: Self-pay

## 2017-04-24 ENCOUNTER — Telehealth: Payer: Self-pay | Admitting: Infectious Diseases

## 2017-04-24 ENCOUNTER — Other Ambulatory Visit: Payer: Self-pay | Admitting: Pharmacist

## 2017-04-24 DIAGNOSIS — I517 Cardiomegaly: Secondary | ICD-10-CM

## 2017-04-24 NOTE — Telephone Encounter (Signed)
Called to check in with Sara Osborne today to see how she is doing. She is much improved today compared to when I talked with her yesterday on the phone. She tells me she has a much better grasp on how much her anxiety was contributing to her situation and found a very helpful APP that she has been working with.   She has some changes in her living situation at home and was not able to speak freely on the phone at this time but she assurred me that she was safe in her home and not victim of any abuse; however 'things have changed' with her fiance and she is 'no longer welcome in her current home.' She [facility] options are but plans on doing so today when she can 'speak freely.'   She has tried to call some of the numbers that she was given from [employer] to secure resources however some of the numbers are not working. She would like to discuss more and would like to meet with Cordelia PenSherry about her anxiety. I will see if Cordelia PenSherry can reach out to her to schedule an appointment prior to her appointment with me to work on some tools to deal with her anxiety. She understands that she cannot answer medical questions and is available for mental/emotional support and resources.   Rexene AlbertsStephanie Dixon, NP

## 2017-04-24 NOTE — Telephone Encounter (Signed)
I did not know the patient was scheduled to come in today, as I thought we were going to talk to the patient together. I did not see your earlier message until after the patient was here. The patient is going to need medication management from a psychiatrist and I do not know how much of service I can be to her with 5 sessions left.   My recommendation is to [facility] and provide her with the [facility] application, which will pay for her visits there.  It will be best care to have a behavior health team managing her care (Psy MD, PhD, LCSW), who can provide long term treatment.  I will reach out to her again.  Thanks

## 2017-04-25 ENCOUNTER — Telehealth: Payer: Self-pay | Admitting: Licensed Clinical Social Worker

## 2017-04-25 NOTE — Telephone Encounter (Signed)
[facility] to discuss patient's multiple hospitalizations and share concerns patient mentioned to provider yesterday about lack of housing.  Billey GoslingCharlie stated that she is aware of the recent hospitalization and has spoken to the patient and scheduled an appointment for this Friday to discuss housing.  [facility] also shared [facility] for behavioral health services.  Vergia AlbertsSherry Bransen Fassnacht, [facility]

## 2017-04-25 NOTE — Telephone Encounter (Signed)
[facility] called patient and introduced the concept of having a [facility] manage her treatment for anxiety, and explained the type of providers there and the [place] locations [place].  [facility] also introduced the [facility] financial program and stated that the program can pay for her treatment with behavioral health.  Patient was receptive to attending treatment there.  [facility] is going to mail patient the [facility] Assistance application today for her to complete and mail in.  [facility] reviewed the one page application over the phone and types of documentation needed to verify income and address.  [facility] even offered assistance with completing the application and the patient stated that she can complete and return on her own.  [facility] will include an envelope to mail the application into the business office.  Vergia AlbertsSherry Gilbert Narain, [facility]

## 2017-04-27 ENCOUNTER — Emergency Department (HOSPITAL_BASED_OUTPATIENT_CLINIC_OR_DEPARTMENT_OTHER): Admit: 2017-04-27 | Discharge: 2017-04-27 | Disposition: A | Discharge status: Home / Self Care | Payer: Self-pay

## 2017-04-27 ENCOUNTER — Encounter (HOSPITAL_COMMUNITY): Payer: Self-pay | Admitting: Emergency Medicine

## 2017-04-27 ENCOUNTER — Encounter: Payer: Self-pay | Admitting: Infectious Diseases

## 2017-04-27 ENCOUNTER — Emergency Department (HOSPITAL_COMMUNITY): Payer: Self-pay

## 2017-04-27 ENCOUNTER — Other Ambulatory Visit: Payer: Self-pay

## 2017-04-27 ENCOUNTER — Telehealth: Payer: Self-pay | Admitting: *Deleted

## 2017-04-27 ENCOUNTER — Emergency Department (HOSPITAL_COMMUNITY)
Admission: EM | Admit: 2017-04-27 | Discharge: 2017-04-27 | Disposition: A | Discharge status: Home / Self Care | Payer: Self-pay | Attending: Emergency Medicine | Admitting: Emergency Medicine

## 2017-04-27 DIAGNOSIS — Z87891 Personal history of nicotine dependence: Secondary | ICD-10-CM | POA: Insufficient documentation

## 2017-04-27 DIAGNOSIS — I10 Essential (primary) hypertension: Secondary | ICD-10-CM | POA: Insufficient documentation

## 2017-04-27 DIAGNOSIS — R259 Unspecified abnormal involuntary movements: Secondary | ICD-10-CM | POA: Insufficient documentation

## 2017-04-27 DIAGNOSIS — R0602 Shortness of breath: Secondary | ICD-10-CM | POA: Insufficient documentation

## 2017-04-27 DIAGNOSIS — R51 Headache: Secondary | ICD-10-CM | POA: Insufficient documentation

## 2017-04-27 DIAGNOSIS — M79605 Pain in left leg: Secondary | ICD-10-CM | POA: Insufficient documentation

## 2017-04-27 DIAGNOSIS — R05 Cough: Secondary | ICD-10-CM | POA: Insufficient documentation

## 2017-04-27 DIAGNOSIS — R531 Weakness: Secondary | ICD-10-CM | POA: Insufficient documentation

## 2017-04-27 DIAGNOSIS — R079 Chest pain, unspecified: Secondary | ICD-10-CM | POA: Insufficient documentation

## 2017-04-27 DIAGNOSIS — R040 Epistaxis: Secondary | ICD-10-CM | POA: Insufficient documentation

## 2017-04-27 DIAGNOSIS — B20 Human immunodeficiency virus [HIV] disease: Secondary | ICD-10-CM | POA: Insufficient documentation

## 2017-04-27 DIAGNOSIS — M79609 Pain in unspecified limb: Secondary | ICD-10-CM

## 2017-04-27 LAB — CBC
HCT: 40.3 % (ref 36.0–46.0)
Hemoglobin: 12.9 g/dL (ref 12.0–15.0)
MCH: 29.9 pg (ref 26.0–34.0)
MCHC: 32 g/dL (ref 30.0–36.0)
MCV: 93.3 fL (ref 78.0–100.0)
Platelets: 263 10*3/uL (ref 150–400)
RBC: 4.32 MIL/uL (ref 3.87–5.11)
RDW: 15.3 % (ref 11.5–15.5)
WBC: 4.5 10*3/uL (ref 4.0–10.5)

## 2017-04-27 LAB — I-STAT BETA HCG BLOOD, ED (MC, WL, AP ONLY): I-stat hCG, quantitative: <5 m[IU]/mL (ref <5)

## 2017-04-27 LAB — BASIC METABOLIC PANEL
Anion gap: 12 (ref 5–15)
BUN: 9 mg/dL (ref 6–20)
CO2: 21 mmol/L — ABNORMAL LOW (ref 22–32)
Calcium: 9 mg/dL (ref 8.9–10.3)
Chloride: 105 mmol/L (ref 101–111)
Creatinine, Ser: 0.75 mg/dL (ref 0.44–1.00)
GFR calc Af Amer: >60 mL/min (ref >60)
GFR calc non Af Amer: >60 mL/min (ref >60)
Glucose, Bld: 124 mg/dL — ABNORMAL HIGH (ref 65–99)
Potassium: 3.9 mmol/L (ref 3.5–5.1)
Sodium: 138 mmol/L (ref 135–145)

## 2017-04-27 LAB — I-STAT TROPONIN, ED
TROPONIN I, POC: 0 ng/mL (ref 0.00–0.08)
Troponin i, poc: 0 ng/mL (ref 0.00–0.08)

## 2017-04-27 NOTE — Telephone Encounter (Signed)
Patient called reporting persistent chest pain that also affects her left arm. She wondered if it might be gas or something else, but reports that it is still there even after 2 aspirin.  She wants to know what to do. RN advised patient that she must have this evaluated at the emergency room. RN advised her to call 911. She stated she will have her fiance drive her. Sara CossHowell, Lilah Mijangos M, RN

## 2017-04-27 NOTE — ED Notes (Signed)
Pt back from X-ray.  

## 2017-04-27 NOTE — ED Notes (Signed)
VSS. All belongings sent home with pt. Verbalized understanding of all d/c instructions and follow up

## 2017-04-27 NOTE — ED Triage Notes (Signed)
Patient reports pain started last night. She reports pain in mid chest, that radiates to left arm. She rates it 6. She took 2 aspirins(325mg  each). Patient states that she has multiple visits to "here"([facility]), she states "I haven't been here since Sunday"

## 2017-04-27 NOTE — ED Notes (Signed)
Pt in restroom 

## 2017-04-27 NOTE — ED Notes (Signed)
ED Provider at bedside. 

## 2017-04-27 NOTE — ED Provider Notes (Signed)
[facility] EMERGENCY DEPARTMENT Provider Note   CSN: [id number] Arrival date & time: 04/27/17  1217     History   Chief Complaint Chief Complaint  Patient presents with  . Chest Pain  . Shortness of Breath    HPI Sara Osborne is a 37 y.o. female.  Patient with history of HIV/AIDS (low CD4, most recent low 100s 03/2017), on prophylactic dapsone and azithromycin, morbid obesity, multiple emergency department visits this month for complaint of chest pain and shortness of breath.  Patient has undergone d-dimer which was negative, CT angiography of the chest which did not show blood clot however was limited by habitus, EKG showing mild sinus tachycardia, BNP's which have been negative.  She has been treated with additional antibiotics for bronchitis/pneumonia as well as albuterol inhaler.  Patient was set up for cardiac echocardiography on 04/24/2017 but states that she was unable to go due to her size and she is revisiting this with her primary care.  Patient presents today with complaint of left upper leg pain which was transient.  She also describes that her bilateral lower extremities have been "heavier than usual".  After this time, her leg pain improved however she developed left-sided chest pain with radiation to the left arm.  This has been ongoing.  She describes mild shortness of breath.  She states that she called her primary care clinic and was told to come to the hospital for further evaluation.  Patient reports sleeping sitting up recently, however this is unchanged and it is more due to the sensation of fluid going down the back of her throat.  She reports intermittent spontaneous nosebleeds and a metalic taste in her mouth.  She has not had any fevers or other URI symptoms currently.  She does have a cough which is productive on some days.  She denies any back pain or abdominal pain.  No neurologic symptoms.  She has ongoing intermittent headaches for which she  was seen and treated after last ED visit.  Patient states that she has been having spontaneous uncontrolled movements of her upper extremities and head.  No focal weakness.  She has been able to walk today.  She does not feel like her lower extremities are more swollen than usual.  No color changes of the skin.  The onset of this condition was acute on chronic. Aggravating factors: none. Alleviating factors: none.        Past Medical History:  Diagnosis Date  . Abnormal Pap smear of cervix 11/05/2009   LGSIL/ASCUS with colposcopy/biopsy 12/2009  . Allergic rhinitis   . 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 Completed tx.   Marland Kitchen HIV (human immunodeficiency virus infection) (HCC) 10/03/2016  . Hypertension   . Knee pain, bilateral 11/06/2016  . Morbid obesity (HCC)    514 lb 2014  . Seasonal allergies 10/03/2016    Patient Active Problem List   Diagnosis Date Noted  . Cardiomegaly 04/12/2017  . Frequent headaches 03/20/2017  . Hypertension 03/20/2017  . Viral URI 03/20/2017  . Chest pain 01/15/2017  . Knee pain, bilateral 11/06/2016  . Healthcare maintenance 11/06/2016  . AIDS (acquired immune deficiency syndrome) (HCC) 10/03/2016  . Obesity, morbid, BMI 50 or higher (HCC) 10/03/2016  . Seasonal allergies 10/03/2016    Past Surgical History:  Procedure Laterality Date  . CESAREAN SECTION  2003    OB History    Gravida Para Term Preterm AB Living  2             SAB TAB Ectopic Multiple Live Births                   Home Medications    Prior to Admission medications   Medication Sig Start Date End Date Taking? Authorizing Provider  acetaminophen (TYLENOL) 500 MG tablet Take 1,000 mg by mouth every 6 (six) hours as needed for mild pain.    [provider]  albuterol (PROVENTIL HFA) 108 (90 Base) MCG/ACT inhaler Inhale 2 puffs into the lungs every 6 (six) hours as needed for wheezing or  shortness of breath.    [provider]  ALPRAZolam Prudy Feeler) 0.5 MG tablet Take 1 tablet (0.5 mg total) by mouth 3 (three) times daily as needed for anxiety. Patient not taking: Reported on 04/20/2017 04/19/17   Pisciotta, Joni Reining, PA-C  amLODipine (NORVASC) 5 MG tablet Take 1 tablet (5 mg total) by mouth daily. Patient not taking: Reported on 04/20/2017 04/16/17   Blanchard Kelch, NP  aspirin 325 MG tablet Take 650 mg by mouth every 6 (six) hours as needed for mild pain.    [provider]  azithromycin (ZITHROMAX Z-PAK) 250 MG tablet Take 1 tablet (250 mg total) by mouth daily. 500mg  PO day 1, then 250mg  PO days 205 Patient not taking: Reported on 04/20/2017 04/19/17   Pisciotta, Joni Reining, PA-C  azithromycin (ZITHROMAX) 600 MG tablet Take 1,200 mg by mouth once a week.    [provider]  chlorpheniramine-HYDROcodone (TUSSIONEX PENNKINETIC ER) 10-8 MG/5ML SUER Take 5 mLs by mouth at bedtime as needed for cough. 03/20/17   Blanchard Kelch, NP  dapsone 100 MG tablet TAKE 1 TABLET BY MOUTH DAILY 04/10/17   Blanchard Kelch, NP  DESCOVY 200-25 MG tablet TAKE 1 TABLET BY MOUTH DAILY 03/14/17   Blanchard Kelch, NP  furosemide (LASIX) 20 MG tablet Take 1 tablet (20 mg total) by mouth daily. 04/18/17   Horton, Mayer Masker, MD  lisinopril-hydrochlorothiazide (PRINZIDE,ZESTORETIC) 10-12.5 MG tablet Take 1 tablet by mouth daily. Patient not taking: Reported on 04/22/2017 04/12/17   Blanchard Kelch, NP  predniSONE (DELTASONE) 20 MG tablet Take 2 tablets (40 mg total) by mouth daily with breakfast. For the next four days Patient not taking: Reported on 04/22/2017 04/10/17   Gerhard Munch, MD  PREZCOBIX 800-150 MG tablet TAKE 1 TABLET BY MOUTH DAILY. SWALLOW WHOLE. DO NOT CRUSH, BREAK, OR CHEW TABLETS. TAKE WITH FOOD. 03/14/17   Blanchard Kelch, NP    Family History Family History  Problem Relation Age of Onset  . Hypertension Mother   . Hypertension Father   . Heart attack  Father     Social History Social History   Tobacco Use  . Smoking status: Former Smoker    Types: Cigarettes    Last attempt to quit: 08/04/2015    Years since quitting: 1.7  . Smokeless tobacco: Never Used  Substance Use Topics  . Alcohol use: No  . Drug use: No     Allergies   Lisinopril; Ginger; Bactrim [sulfamethoxazole-trimethoprim]; and Sulfa antibiotics   Review of Systems Review of Systems  Constitutional: Negative for diaphoresis and fever.  HENT: Positive for nosebleeds. Negative for rhinorrhea and sore throat.   Eyes: Negative for redness.  Respiratory: Positive for cough and shortness of breath.   Cardiovascular: Positive for chest pain. Negative for palpitations and leg swelling.  Gastrointestinal: Negative for abdominal pain, diarrhea, nausea and vomiting.  Genitourinary: Negative for dysuria.  Musculoskeletal: Positive for myalgias (L thigh, resolved). Negative for arthralgias, back pain and neck pain.  Skin: Negative for rash.  Neurological: Positive for weakness (healvy lower extremities) and headaches. Negative for syncope and light-headedness.  Psychiatric/Behavioral: The patient is not nervous/anxious.      Physical Exam Updated Vital Signs BP (!) 160/94 (BP Location: Right Arm)   Pulse (!) 109   Temp 98.1 F (36.7 C) (Oral)   Ht 5\' 4"  (1.626 m)   Wt (!) 251.3 kg (554 lb)   LMP 04/03/2017   SpO2 98%   BMI 95.09 kg/m   Physical Exam  Constitutional: She appears well-developed and well-nourished.  HENT:  Head: Normocephalic and atraumatic.  Right Ear: Tympanic membrane, external ear and ear canal normal.  Left Ear: Tympanic membrane, external ear and ear canal normal.  Nose: Nose normal. No mucosal edema or rhinorrhea.  Mouth/Throat: Uvula is midline, oropharynx is clear and moist and mucous membranes are normal. Mucous membranes are not dry. No oral lesions. No trismus in the jaw. No uvula swelling. No oropharyngeal exudate, posterior  oropharyngeal edema, posterior oropharyngeal erythema or tonsillar abscesses.  Oropharynx clear.  No sequelae of epistaxis.  Eyes: Conjunctivae are normal. Right eye exhibits no discharge. Left eye exhibits no discharge.  Neck: Trachea normal and normal range of motion. Neck supple. Normal carotid pulses and no JVD present. No muscular tenderness present. Carotid bruit is not present. No tracheal deviation present.  Cardiovascular: Normal rate, regular rhythm, S1 normal, S2 normal, normal heart sounds and intact distal pulses. Exam reveals no decreased pulses.  No murmur heard. Pulses:      Dorsalis pedis pulses are 2+ on the right side, and 2+ on the left side.  Pulmonary/Chest: Effort normal and breath sounds normal. No respiratory distress. She has no wheezes. She has no rhonchi. She has no rales. She exhibits no tenderness.  Abdominal: Soft. Normal aorta and bowel sounds are normal. There is no tenderness. There is no rebound and no guarding.  Musculoskeletal: Normal range of motion.       Right lower leg: She exhibits no tenderness.       Left lower leg: She exhibits no tenderness.  No edema or tenderness of either leg with palpation.   Lymphadenopathy:    She has no cervical adenopathy.  Neurological: She is alert.  Skin: Skin is warm and dry. She is not diaphoretic. No cyanosis. No pallor.  Psychiatric: She has a normal mood and affect.  Nursing note and vitals reviewed.    ED Treatments / Results  Labs (all labs ordered are listed, but only abnormal results are displayed) Labs Reviewed  BASIC METABOLIC PANEL - Abnormal; Notable for the following components:      Result Value   CO2 21 (*)    Glucose, Bld 124 (*)    All other components within normal limits  CBC  I-STAT TROPONIN, ED  I-STAT BETA HCG BLOOD, ED ([facility], [facility], [facility] ONLY)    EKG  EKG Interpretation  Date/Time:  Friday April 27 2017 16:36:42 EST Ventricular Rate:  100 PR Interval:  140 QRS Duration: 86 QT  Interval:  342 QTC Calculation: 441 R Axis:   62 Text Interpretation:  Normal sinus rhythm Normal ECG Confirmed by Raeford RazorKohut, Stephen [phone]([id number]) on 04/27/2017 5:32:14 PM       Radiology Dg Chest 2 View  Result Date: 04/27/2017 CLINICAL DATA:  Mid chest pain that started last night. EXAM: CHEST  2 VIEW COMPARISON:  04/20/2017 FINDINGS: Normal heart size and mediastinal contours. Mild prominence of interstitial markings also seen previously, with no interstitial findings on 04/14/2017 chest CT. Increased density over the midthoracic spine in the lateral projection is likely summation shadows. No suspected airspace disease. No effusion or pneumothorax. IMPRESSION: Negative chest. Electronically Signed   By: Marnee Spring M.D.   On: 04/27/2017 13:20    Procedures Procedures (including critical care time)  Medications Ordered in ED Medications - No data to display   Initial Impression / Assessment and Plan / ED Course  I have reviewed the triage vital signs and the nursing notes.  Pertinent labs & imaging results that were available during my care of the patient were reviewed by me and considered in my medical decision making (see chart for details).     Patient seen and examined.  Reviewed extensively workup and previous notes over the past month.  There have been some hints of congestive heart failure with CT findings and signs of cardiomegaly on imaging.  Fortunately patient has been unable to get an echocardiogram.  She does not appear to be particularly fluid overloaded today.  Plan is to check delta troponin and repeat EKG at 3-hour mark, ambulate with pulse ox, evaluate left leg given transient pain for DVT.  Overall low suspicion for DVT or PE.  Vital signs reviewed and are as follows: BP (!) 160/94 (BP Location: Right Arm)   Pulse (!) 109   Temp 98.1 F (36.7 C) (Oral)   Ht 5\' 4"  (1.626 m)   Wt (!) 251.3 kg (554 lb)   LMP 04/03/2017   SpO2 98%   BMI 95.09 kg/m   5:35 PM  patient was able to ambulate with no hypoxia.  Mild tachycardia at baseline.  Delta troponin and EKG are unchanged.  No evidence of left lower extremity DVT.  On reexam, patient does not have any chest pain.  She continues to complain of fluid in the back of her throat.  She is happy that her workup is negative here and agrees to follow-up closely with her primary care who is addressing her multiple concerns.  Encourage patient to return to the emergency department with new or atypical symptoms, persistent chest pains especially if they are different than before, trouble breathing, new symptoms or other concerns.  She verbalizes understanding and agrees with plan.  Discussed with Dr. Juleen China prior to discharge.   BP (!) 160/94 (BP Location: Right Arm)   Pulse (!) 103   Temp 98.1 F (36.7 C) (Oral)   Resp 18   Ht 5\' 4"  (1.626 m)   Wt (!) 251.3 kg (554 lb)   LMP 04/03/2017   SpO2 97%   BMI 95.09 kg/m    Final Clinical Impressions(s) / ED Diagnoses   Final diagnoses:  Chest pain, unspecified type  Pain of left lower extremity   Patient presents with chest pain, shortness of breath, and left lower extremity pain today.  Left lower extremity pain resolved.  Patient has had extensive workup in the past several weeks including a chest CT which did not show blood clot.  Possible CHF or congestion --pending echo.  Overall, additional workup today is unrevealing.  Do not suspect ACS, PE, pneumonia, pneumothorax.  No significant infectious symptoms.  Patient is on appropriate prophylactic therapy.  I feel that patient is safe for discharge home at this time with continued outpatient workup through her primary care.  ED Discharge Orders    None  Renne Crigler, PA-C 04/27/17 1738    Raeford Razor, MD 04/27/17 [phone]

## 2017-04-27 NOTE — Progress Notes (Signed)
*  PRELIMINARY RESULTS* Vascular Ultrasound Left lower extremity venous duplex has been completed.  Preliminary findings: Limited exam due to patient body habitus.  Visualized veins of the left lower extremity appear negative for thrombosis and bakers cyst.  Sara Osborne 04/27/2017, 4:09 PM

## 2017-04-27 NOTE — Discharge Instructions (Signed)
Please read and follow all provided instructions.  Your diagnoses today include:  1. Chest pain, unspecified type   2. Pain of left lower extremity     Tests performed today include:  An EKG of your heart  A chest x-ray - no pneumonia  Cardiac enzymes - a blood test for heart muscle damage, no sign of heart attack  Blood counts and electrolytes  Ultrasound of your left leg - does not show any blood clots  Vital signs. See below for your results today.   Medications prescribed:   None  Take any prescribed medications only as directed.  Follow-up instructions: Please follow-up with your primary care provider as soon as you can for further evaluation of your symptoms.   Return instructions:  SEEK IMMEDIATE MEDICAL ATTENTION IF:  You have severe chest pain, especially if the pain is crushing or pressure-like and spreads to the arms, back, neck, or jaw, or if you have sweating, nausea (feeling sick to your stomach), or shortness of breath. THIS IS AN EMERGENCY. Don't wait to see if the pain will go away. Get medical help at once. Call 911 or 0 (operator). DO NOT drive yourself to the hospital.   Your chest pain gets worse and does not go away with rest.   You have an attack of chest pain lasting longer than usual, despite rest and treatment with the medications your caregiver has prescribed.   You wake from sleep with chest pain or shortness of breath.  You feel dizzy or faint.  You have chest pain not typical of your usual pain for which you originally saw your caregiver.   You have any other emergent concerns regarding your health.  Additional Information: Chest pain comes from many different causes. Your caregiver has diagnosed you as having chest pain that is not specific for one problem, but does not require admission.  You are at low risk for an acute heart condition or other serious illness.   Your vital signs today were: BP (!) 160/94 (BP Location: Right Arm)     Pulse (!) 103    Temp 98.1 F (36.7 C) (Oral)    Resp 18    Ht 5\' 4"  (1.626 m)    Wt (!) 251.3 kg (554 lb)    LMP 04/03/2017    SpO2 97%    BMI 95.09 kg/m  If your blood pressure (BP) was elevated above 135/85 this visit, please have this repeated by your doctor within one month. --------------

## 2017-04-27 NOTE — ED Notes (Signed)
Sara HartPatrick EMT at the bedside obtaining repeat EKG

## 2017-04-27 NOTE — ED Notes (Addendum)
Pt ambulatory with steady gait. O2 sat remained at approx 97-99% on RA. Pt HR increased from 105 bpm to approx 127 HR while ambulating. Max HR while ambulating was 136 bpm

## 2017-04-27 NOTE — ED Notes (Signed)
Patient transported to X-ray 

## 2017-04-27 NOTE — ED Notes (Addendum)
Patient transported to vascular on portable monitor

## 2017-04-30 ENCOUNTER — Encounter: Payer: Self-pay | Admitting: Infectious Diseases

## 2017-05-01 ENCOUNTER — Ambulatory Visit (INDEPENDENT_AMBULATORY_CARE_PROVIDER_SITE_OTHER): Payer: Self-pay | Admitting: Cardiology

## 2017-05-01 ENCOUNTER — Encounter: Payer: Self-pay | Admitting: Cardiology

## 2017-05-01 VITALS — BP 120/82 | HR 118 | Ht 64.0 in | Wt >= 6400 oz

## 2017-05-01 DIAGNOSIS — I10 Essential (primary) hypertension: Secondary | ICD-10-CM

## 2017-05-01 NOTE — Progress Notes (Signed)
Cardiology Office Note   Date:  05/01/2017   ID:  Sara Osborne, DOB 1981-01-09, MRN [id number]  PCP:  Blanchard Kelchixon, Stephanie N, NP  Cardiologist:   Jaeleigh Monaco [place], MD   Chief Complaint  Patient presents with  . Chest Pain  . Shortness of Breath      History of Present Illness: Sara Buttslizabeth Delmar is a 37 y.o. female who is seen at the request of Rexene AlbertsStephanie Dixon NP for evaluation of ? cardiomegaly noted on CXR, HTN, and chest pain. She has a history of HTN, morbid obesity, and HIV. She has been seen in the ED multiple times over the past month with a variety of complaints including, SOB, chest pain, HA, weakness. Multiple labs including troponins, BNP, D-dimer all negative. Ecg without acute findings. One CXR mentioned mild cardiomegaly but other CXRs did not mention this. CT chest negative for PE. ? Mild interstitial prominence. Exam limited due to patient size. LE venous doppler negative for DVT. Echo ordered but could not be done due to patient's weight exceeding weight limit of table.  Patient reports massive obesity for years. Weight > 550 lbs. Does note chronic SOB. Activity very limited. Denies any significant chest pain on exam today. Was started on lisinopril but had a reaction to this and was switched to amlodipine. Apparently scheduled for a sleep study.     Past Medical History:  Diagnosis Date  . Abnormal Pap smear of cervix 11/05/2009   LGSIL/ASCUS with colposcopy/biopsy 12/2009  . Allergic rhinitis   . 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 Completed tx.   Marland Kitchen. HIV (human immunodeficiency virus infection) (HCC) 10/03/2016  . Hypertension   . Knee pain, bilateral 11/06/2016  . Morbid obesity (HCC)    514 lb 2014  . Seasonal allergies 10/03/2016    Past Surgical History:  Procedure Laterality Date  . CESAREAN SECTION  2003     Current Outpatient Medications  Medication Sig Dispense Refill  .  acetaminophen (TYLENOL) 500 MG tablet Take 1,000 mg by mouth every 6 (six) hours as needed for mild pain.    Marland Kitchen. albuterol (PROVENTIL HFA) 108 (90 Base) MCG/ACT inhaler Inhale 2 puffs into the lungs every 6 (six) hours as needed for wheezing or shortness of breath.    Marland Kitchen. aspirin 325 MG tablet Take 650 mg by mouth every 6 (six) hours as needed for mild pain.    Marland Kitchen. azithromycin (ZITHROMAX) 600 MG tablet Take 1,200 mg by mouth once a week.    . chlorpheniramine-HYDROcodone (TUSSIONEX PENNKINETIC ER) 10-8 MG/5ML SUER Take 5 mLs by mouth at bedtime as needed for cough. 140 mL 0  . dapsone 100 MG tablet TAKE 1 TABLET BY MOUTH DAILY 30 tablet 3  . DESCOVY 200-25 MG tablet TAKE 1 TABLET BY MOUTH DAILY 30 tablet 2  . furosemide (LASIX) 20 MG tablet Take 1 tablet (20 mg total) by mouth daily. 4 tablet 0  . PREZCOBIX 800-150 MG tablet TAKE 1 TABLET BY MOUTH DAILY. SWALLOW WHOLE. DO NOT CRUSH, BREAK, OR CHEW TABLETS. TAKE WITH FOOD. 30 tablet 2   No current facility-administered medications for this visit.     Allergies:   Lisinopril; Ginger; Bactrim [sulfamethoxazole-trimethoprim]; and Sulfa antibiotics    Social History:  The patient  reports that she quit smoking about 20 months ago. Her smoking use included cigarettes. she has never used smokeless tobacco. She reports that she does not drink alcohol or use drugs.  Family History:  The patient's family history includes Heart attack in her father; Hypertension in her father and mother.    ROS:  Please see the history of present illness.   Otherwise, review of systems are positive for none.   All other systems are reviewed and negative.    PHYSICAL EXAM: VS:  BP 120/82   Pulse (!) 118   Ht 5\' 4"  (1.626 m)   Wt (!) 557 lb (252.7 kg)   LMP 04/03/2017   SpO2 96%   BMI 95.61 kg/m  , BMI Body mass index is 95.61 kg/m. GEN: Massively obese BF in NAD  HEENT: normal  Neck: no JVD, carotid bruits, or masses Cardiac: RRR; soft 1/6 systolic murmur LUSB.  No rubs, or gallops,no edema  Respiratory:  clear to auscultation bilaterally, normal work of breathing GI: soft, nontender, nondistended, + BS MS: no deformity or atrophy  Skin: warm and dry, no rash Neuro:  Strength and sensation are intact Psych: euthymic mood, full affect   EKG:  EKG is not ordered today. The ekg ordered 04/27/17 demonstrates NSR rate 100. Normal Ecg.    Recent Labs: 01/15/2017: Magnesium 2.1 04/20/2017: B Natriuretic Peptide 14.7 04/22/2017: ALT 16 04/27/2017: BUN 9; Creatinine, Ser 0.75; Hemoglobin 12.9; Platelets 263; Potassium 3.9; Sodium 138    Lipid Panel    Component Value Date/Time   CHOL 180 10/23/2016 1644   TRIG 66 10/23/2016 1644   HDL 56 10/23/2016 1644   CHOLHDL 3.2 10/23/2016 1644   VLDL 13 10/23/2016 1644   LDLCALC 111 (H) 10/23/2016 1644      Wt Readings from Last 3 Encounters:  05/01/17 (!) 557 lb (252.7 kg)  04/27/17 (!) 554 lb (251.3 kg)  04/20/17 (!) 567 lb (257.2 kg)      Other studies Reviewed: Additional studies/ records that were reviewed today include: CXR and CT chest personally reviewed.    ASSESSMENT AND PLAN:  1.  Massive obesity. BMI> 50. Associated marked deconditioning and chronic dyspnea related to obesity. No evidence of CHF by exam, Normal BNP, CXR and CT chest. The CXR that suggested mild cardiomegaly was more lordotic that other studies. Otherwise normal. At this point I don't feel that further cardiac evaluation is necessary. Needs to proceed with sleep study. Focus on weight loss. Little else to offer. I will see back PRN.   Current medicines are reviewed at length with the patient today.  The patient does not have concerns regarding medicines.  The following changes have been made:  no change  Labs/ tests ordered today include:  No orders of the defined types were placed in this encounter.    Disposition:   FU PRN   Signed, Haisley Arens [place], MD  05/01/2017 4:54 PM    [facility]  [facility] [address] [place], [place], [address] Phone [phone], Fax [phone]

## 2017-05-05 ENCOUNTER — Other Ambulatory Visit: Payer: Self-pay | Admitting: Infectious Diseases

## 2017-05-05 DIAGNOSIS — B20 Human immunodeficiency virus [HIV] disease: Secondary | ICD-10-CM

## 2017-05-07 ENCOUNTER — Ambulatory Visit: Payer: Self-pay | Admitting: Infectious Diseases

## 2017-05-07 ENCOUNTER — Encounter: Payer: Self-pay | Admitting: Infectious Diseases

## 2017-05-08 ENCOUNTER — Encounter: Payer: Self-pay | Admitting: Infectious Diseases

## 2017-05-08 ENCOUNTER — Ambulatory Visit: Payer: Self-pay | Admitting: Infectious Diseases

## 2017-05-08 ENCOUNTER — Ambulatory Visit (INDEPENDENT_AMBULATORY_CARE_PROVIDER_SITE_OTHER): Payer: Self-pay | Admitting: Infectious Diseases

## 2017-05-08 VITALS — BP 142/88 | HR 109 | Temp 98.0°F | Ht 64.0 in | Wt >= 6400 oz

## 2017-05-08 DIAGNOSIS — R29818 Other symptoms and signs involving the nervous system: Secondary | ICD-10-CM

## 2017-05-08 DIAGNOSIS — B20 Human immunodeficiency virus [HIV] disease: Secondary | ICD-10-CM

## 2017-05-08 DIAGNOSIS — Z113 Encounter for screening for infections with a predominantly sexual mode of transmission: Secondary | ICD-10-CM

## 2017-05-08 DIAGNOSIS — I10 Essential (primary) hypertension: Secondary | ICD-10-CM

## 2017-05-08 MED ORDER — HYDROCHLOROTHIAZIDE 12.5 MG PO TABS: 12.5000 mg | ORAL_TABLET | Freq: Every day | ORAL | 3 refills | Status: AC

## 2017-05-08 NOTE — Progress Notes (Signed)
Sara Osborne  08-08-1980 [id number] PCP: Greer Callas, NP   Reason for Visit: Routine HIV care  Brief Narrative: Sara Osborne is a 37 y.o. AA female with HIV infection. Originally dx in 1999 during her first pregnancy. Previous regimens: Combivir/Viracept early 2000s. Atripla from 2011 - 2014. Out of care from 2005 - 2011. OIs: MAC (+blood culture, asymptomatic) and tx with ethambutol/azithro/rifabutin x 12 months. HIV Risk: heterosexual unprotected sex Genotype: 10/2016 [id number]   Patient Active Problem List   Diagnosis Date Noted  . Suspected sleep apnea 05/09/2017  . Frequent headaches 03/20/2017  . Hypertension 03/20/2017  . Chest pain 01/15/2017  . Knee pain, bilateral 11/06/2016  . Routine screening for STI (sexually transmitted infection) 11/06/2016  . AIDS (acquired immune deficiency syndrome) ([place]) 10/03/2016  . Obesity, morbid, BMI 50 or higher ([place]) 10/03/2016  . Seasonal allergies 10/03/2016    HPI:  Sara Osborne is here with her fiance Deshawn for follow up on her HIV and other more recent medical issues. She has been to the ED several times for various complaints including shortness of breath, cough, headaches, face/head pressure, nasal drainage and anxiety. She is working through some of her feelings but still has many of these physical symptoms but feels her anxiety is under better control. She has started taking her amlodipine once a day but feels she needs a refill on her lasix because her legs are having trouble swelling on occasion. She feels as if the amlodipine is lowering her HR and she takes it in the evening. She continues to sleep propped up on many pillows so she can breathe better. Wakens many times at night "panicy." She has been called for sleep study but will be out of town for it and will try to reschedule if she can prior to moving to [place].   She has been seen by cardiology (Dr. Martinique) and do not feel she needs echo to evaluate chest xray results as  they were likely more due to positioning rather than true cardiomegaly. He gave a very encouraging report and she liked him very much. She continues to have periodic nose bleeds. She does pick her nose to help get the crusts out. Slow trickle of blood never large amounts or clots. She takes Aspirin periodically still.   She is concerned about her viral load as the numbers are creeping up. She has only missed one dose with her ED visits but has otherwise taken it every day. Times of administration varies. She continue her dapsone and azithromycin weekly for PJP and secondary MAD prevention. After her fiance stepped out of the room she confided in me that she learned recently he has been cheating on her and she is requesting STI testing. She has a bump on her genitals that has popped up. No ulcer, minimal pain and no drainage. She feels this may actually have gone away now but not certain. No other active signs or symptoms for STI but would like to be checked.   Review of Systems  Constitutional: Negative for chills, fever, malaise/fatigue and weight loss.  HENT: Positive for congestion and sinus pain. Negative for ear pain and sore throat.   Respiratory: Positive for cough. Negative for sputum production and shortness of breath.   Cardiovascular: Negative for chest pain and leg swelling.  Gastrointestinal: Negative for abdominal pain, diarrhea and vomiting.  Genitourinary: Negative for dysuria and urgency.  Musculoskeletal: Positive for joint pain (knee and feet bilaterally ). Negative for myalgias and neck pain.  Skin:  Negative for rash.  Neurological: Positive for headaches.  Psychiatric/Behavioral: Negative for depression and substance abuse. The patient is not nervous/anxious.    Past Medical History:  Diagnosis Date  . Abnormal Pap smear of cervix 11/05/2009   LGSIL/ASCUS with colposcopy/biopsy 12/2009  . Allergic rhinitis   . 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 Completed tx.   Marland Kitchen HIV (human immunodeficiency virus infection) ([place]) 10/03/2016  . Hypertension   . Knee pain, bilateral 11/06/2016  . Morbid obesity ([place])    514 lb 2014  . Seasonal allergies 10/03/2016   Outpatient Medications Prior to Visit  Medication Sig Dispense Refill  . acetaminophen (TYLENOL) 500 MG tablet Take 1,000 mg by mouth every 6 (six) hours as needed for mild pain.    Marland Kitchen albuterol (PROVENTIL HFA) 108 (90 Base) MCG/ACT inhaler Inhale 2 puffs into the lungs every 6 (six) hours as needed for wheezing or shortness of breath.    Marland Kitchen aspirin 325 MG tablet Take 650 mg by mouth every 6 (six) hours as needed for mild pain.    Marland Kitchen azithromycin (ZITHROMAX) 600 MG tablet Take 1,200 mg by mouth once a week.    . dapsone 100 MG tablet TAKE 1 TABLET BY MOUTH DAILY 30 tablet 3  . DESCOVY 200-25 MG tablet TAKE 1 TABLET BY MOUTH DAILY 30 tablet 3  . furosemide (LASIX) 20 MG tablet Take 1 tablet (20 mg total) by mouth daily. 4 tablet 0  . PREZCOBIX 800-150 MG tablet TAKE 1 TABLET BY MOUTH DAILY WITH FOOD. SWALLOW WHOLE; DO NOT CRUSH, BREAK OR CHEW TABLETS 30 tablet 3  . lisinopril-hydrochlorothiazide (PRINZIDE,ZESTORETIC) 10-12.5 MG tablet TK 1 T PO D  2  . chlorpheniramine-HYDROcodone (TUSSIONEX PENNKINETIC ER) 10-8 MG/5ML SUER Take 5 mLs by mouth at bedtime as needed for cough. 140 mL 0   No facility-administered medications prior to visit.    Allergies  Allergen Reactions  . Lisinopril Shortness Of Breath  . Ginger Hives  . Bactrim [Sulfamethoxazole-Trimethoprim] Rash    Fever  . Sulfa Antibiotics Rash   Social History   Tobacco Use  . Smoking status: Former Smoker    Types: Cigarettes    Last attempt to quit: 08/04/2015    Years since quitting: 1.7  . Smokeless tobacco: Never Used  Substance Use Topics  . Alcohol use: No  . Drug use: No   Objective: Vitals:   05/08/17 0942  BP: (!) 142/88  Pulse: (!) 109  Temp: 98 F (36.7 C)    TempSrc: Oral  Weight: (!) 555 lb (251.7 kg)  Height: _0  (1.626 m)   Body mass index is 95.27 kg/m.  Physical Exam  Constitutional: She is oriented to person, place, and time and well-developed, well-nourished, and in no distress.  Obese pleasant female accompanied by her fiance today.   HENT:  Right Ear: External ear normal.  Left Ear: External ear normal.  Nose: Nose normal.  Mouth/Throat: Oropharynx is clear and moist. No oral lesions. No dental abscesses.  Eyes: EOM are normal. Pupils are equal, round, and reactive to light. No scleral icterus.  Cardiovascular: Regular rhythm and normal heart sounds. Tachycardia present.  Pulmonary/Chest: Effort normal and breath sounds normal. No respiratory distress. She has no wheezes. She has no rales.  Diminished   Abdominal: Soft. She exhibits no distension. There is no tenderness.  Genitourinary:  Genitourinary Comments: Deferred as she does not have time for exam today  Musculoskeletal: She exhibits no tenderness.  Lymphadenopathy:    She has no cervical adenopathy.  Neurological: She is alert and oriented to person, place, and time. No cranial nerve deficit.  Skin: Skin is warm and dry. No rash noted.  Psychiatric: Mood, affect and judgment normal.   Lab Results Lab Results  Component Value Date   WBC 4.5 04/27/2017   HGB 12.9 04/27/2017   HCT 40.3 04/27/2017   MCV 93.3 04/27/2017   PLT 263 04/27/2017    Lab Results  Component Value Date   CREATININE 0.75 04/27/2017   BUN 9 04/27/2017   NA 138 04/27/2017   K 3.9 04/27/2017   CL 105 04/27/2017   CO2 21 (L) 04/27/2017    Lab Results  Component Value Date   ALT 16 04/22/2017   AST 21 04/22/2017   ALKPHOS 97 04/22/2017   BILITOT 0.8 04/22/2017    Lab Results  Component Value Date   CHOL 180 10/23/2016   HDL 56 10/23/2016   LDLCALC 111 (H) 10/23/2016   TRIG 66 10/23/2016   CHOLHDL 3.2 10/23/2016   HIV 1 RNA Quant  Date Value  04/12/2017 43 copies/mL (H)   03/20/2017 29 copies/mL (H)  01/15/2017 CANCELED  01/15/2017 119 copies/mL (H)   CD4 T Cell Abs (/uL)  Date Value  03/20/2017 100 (L)  01/15/2017 120 (L)  11/06/2016 220 (L)   Lab Results  Component Value Date   HAV NON REACTIVE 10/03/2016   Lab Results  Component Value Date   HEPBSAG NEGATIVE 10/03/2016   HEPBSAB POS (A) 10/03/2016   No results found for: HCVAB Lab Results  Component Value Date   CHLAMYDIAWP Negative 10/03/2016   CHLAMYDIAWP Negative 10/03/2016   N Negative 10/03/2016   N Negative 10/03/2016   No results found for: GCPROBEAPT Lab Results  Component Value Date   QUANTGOLD NEGATIVE 10/03/2016   No results found for: RPR    Problem List Items Addressed This Visit      Cardiovascular and Mediastinum   Hypertension    I do not feel she needs lasix as part of regimen. Reported leg swelling could be due to amlodipine (although doubtful as she had this prior to administration) or more likely undiagnosed sleep apnea. I will add low dose HCTZ 12.5 mg to see if this works to help control. Her BP is just above target today so reasonable to add this for her. Will hold off on combo pill since she is sensitive to medications and will take them each independently. Counseled to take HCTZ in AM due to increased urination. Continued to counsel on weight reduction.       Relevant Medications   hydrochlorothiazide (HYDRODIURIL) 12.5 MG tablet     Other   AIDS (acquired immune deficiency syndrome) ([place]) - Primary    She has not been able to be fully suppressed on Prezcobix + Descovy and assures me she that adherence is not a problem. I have counseled her to take this daily with full meal at the same time of day. I do wonder if her BSA/BMI has anything to do with this. I offered to switch her Descovy to [facility] to give her an additional agent to intensify and will get a slight boost effect to bictegravir with the cobicistat. Discussed with our pharmacy team and although  there is nothing regarding BSA/BMI contributing in drug studies it makes theoretical sense.   She would like to continue on her current regimen and check blood work today to  see what things look like. Also discussed her CD4 count - her immune system is slow to reconstitute which I am not surprised at considering her prolonged periods of untreated HIV. She will continue OI and secondary MAC prophylaxis. Attempted to reassure her today that her virus is under very good control which is important for her to maintain.       Relevant Orders   HIV 1 RNA quant-no reflex-bld   T-helper cell (CD4)- ([facility] only)   Routine screening for STI (sexually transmitted infection)    Will check RPR, GC/C and trichomonas from urine sample today. She does not have time for pelvic exam which is better yield for STI's that could lead to pelvic infection however considering she has no symptoms urine NAAAT likely OK. She does not have anal intercourse and will defer rectal testing.       Relevant Orders   RPR   Urine cytology ancillary only   Suspected sleep apnea    Very suspicious of obstructive sleep apnea contributing to her overall presentation considering her increased headaches, daytime sleepiness/fatgue, frequent awakenings at night, necessity to sleep propped up to avoid snoring and leg swelling. I advised her that she needs to undergo sleep study and get assessed and set up with treatment CPAP and please try to get this done as soon as she is able.         I spent greater than 42mnutes with the patient including greater than 50% of time in face to face counsel of the patient re HIV, recent labs and ED visits, htn, hypertension/cardiology notes and in coordination of their care.  SJanene Madeira MSN, NP-C [facility] 05/09/17 8:27 AM

## 2017-05-08 NOTE — Patient Instructions (Addendum)
Small amount of Vasaline to your nose to help with bleeding.   Will try you on a low dose diuretic for your swelling/blood pressure - take one pill once a day in the morning. This will make you pee more.   Will check labs today for you. I would like to consider switching your Descovy to a medication called Biktarvy.   Please reschedule your sleep study when you can    Sleep Apnea Sleep apnea is a condition that affects breathing. People with sleep apnea have moments during sleep when their breathing pauses briefly or gets shallow. Sleep apnea can cause these symptoms:  Trouble staying asleep.  Sleepiness or tiredness during the day.  Irritability.  Loud snoring.   Morning headaches.  Trouble concentrating.  Forgetting things.  Less interest in sex.  Being sleepy for no reason.  Mood swings.  Personality changes.  Depression.  Waking up a lot during the night to pee (urinate).  Dry mouth.  Sore throat.  Follow these instructions at home:  Make any changes in your routine that your doctor recommends.  Eat a healthy, well-balanced diet.  Take over-the-counter and prescription medicines only as told by your doctor.  Avoid using alcohol, calming medicines (sedatives), and narcotic medicines.  Take steps to lose weight if you are overweight.  If you were given a machine (device) to use while you sleep, use it only as told by your doctor.  Do not use any tobacco products, such as cigarettes, chewing tobacco, and e-cigarettes. If you need help quitting, ask your doctor.  Keep all follow-up visits as told by your doctor. This is important. Contact a doctor if:  The machine that you were given to use during sleep is uncomfortable or does not seem to be working.  Your symptoms do not get better.  Your symptoms get worse. Get help right away if:  Your chest hurts.  You have trouble breathing in enough air (shortness of breath).  You have an uncomfortable  feeling in your back, arms, or stomach.  You have trouble talking.  One side of your body feels weak.  A part of your face is hanging down (drooping). These symptoms may be an emergency. Do not wait to see if the symptoms will go away. Get medical help right away. Call your local emergency services (911 in the [place] Do not drive yourself to the hospital. This information is not intended to replace advice given to you by your health care provider. Make sure you discuss any questions you have with your health care provider. Document Released: 12/28/2007 Document Revised: 11/14/2015 Document Reviewed: 12/28/2014 Elsevier Interactive Patient Education  [employer]2018 [employer] Inc.

## 2017-05-09 DIAGNOSIS — R29818 Other symptoms and signs involving the nervous system: Secondary | ICD-10-CM | POA: Insufficient documentation

## 2017-05-09 LAB — URINE CYTOLOGY ANCILLARY ONLY
Chlamydia: NEGATIVE
Neisseria Gonorrhea: NEGATIVE
TRICH (WINDOWPATH): NEGATIVE

## 2017-05-09 LAB — RPR: RPR Ser Ql: NONREACTIVE

## 2017-05-09 LAB — T-HELPER CELL (CD4) - (RCID CLINIC ONLY)
CD4 T CELL HELPER: 9 % — AB (ref 33–55)
CD4 T Cell Abs: 110 /uL — ABNORMAL LOW (ref 400–2700)

## 2017-05-09 NOTE — Assessment & Plan Note (Signed)
Very suspicious of obstructive sleep apnea contributing to her overall presentation considering her increased headaches, daytime sleepiness/fatgue, frequent awakenings at night, necessity to sleep propped up to avoid snoring and leg swelling. I advised her that she needs to undergo sleep study and get assessed and set up with treatment CPAP and please try to get this done as soon as she is able.

## 2017-05-09 NOTE — Assessment & Plan Note (Signed)
Will check RPR, GC/C and trichomonas from urine sample today. She does not have time for pelvic exam which is better yield for STI's that could lead to pelvic infection however considering she has no symptoms urine NAAAT likely OK. She does not have anal intercourse and will defer rectal testing.

## 2017-05-09 NOTE — Assessment & Plan Note (Addendum)
She has not been able to be fully suppressed on Prezcobix + Descovy and assures me she that adherence is not a problem. I have counseled her to take this daily with full meal at the same time of day. I do wonder if her BSA/BMI has anything to do with this. I offered to switch her Descovy to [facility] to give her an additional agent to intensify and will get a slight boost effect to bictegravir with the cobicistat. Discussed with our pharmacy team and although there is nothing regarding BSA/BMI contributing in drug studies it makes theoretical sense.   She would like to continue on her current regimen and check blood work today to see what things look like. Also discussed her CD4 count - her immune system is slow to reconstitute which I am not surprised at considering her prolonged periods of untreated HIV. She will continue OI and secondary MAC prophylaxis. Attempted to reassure her today that her virus is under very good control which is important for her to maintain.

## 2017-05-09 NOTE — Assessment & Plan Note (Signed)
I do not feel she needs lasix as part of regimen. Reported leg swelling could be due to amlodipine (although doubtful as she had this prior to administration) or more likely undiagnosed sleep apnea. I will add low dose HCTZ 12.5 mg to see if this works to help control. Her BP is just above target today so reasonable to add this for her. Will hold off on combo pill since she is sensitive to medications and will take them each independently. Counseled to take HCTZ in AM due to increased urination. Continued to counsel on weight reduction.

## 2017-05-10 LAB — HIV-1 RNA QUANT-NO REFLEX-BLD
HIV 1 RNA QUANT: 26 {copies}/mL — AB
HIV-1 RNA Quant, Log: 1.41 Log copies/mL — ABNORMAL HIGH

## 2017-05-14 ENCOUNTER — Encounter: Payer: Self-pay | Admitting: Infectious Diseases

## 2017-05-16 ENCOUNTER — Ambulatory Visit: Payer: Self-pay | Admitting: Cardiology

## 2017-05-22 ENCOUNTER — Encounter: Payer: Self-pay | Admitting: *Deleted

## 2017-06-02 ENCOUNTER — Other Ambulatory Visit: Payer: Self-pay | Admitting: Infectious Diseases

## 2017-06-02 DIAGNOSIS — I10 Essential (primary) hypertension: Secondary | ICD-10-CM

## 2017-06-27 ENCOUNTER — Other Ambulatory Visit: Payer: Self-pay | Admitting: Infectious Diseases

## 2017-06-27 DIAGNOSIS — B370 Candidal stomatitis: Secondary | ICD-10-CM

## 2017-06-27 DIAGNOSIS — B20 Human immunodeficiency virus [HIV] disease: Secondary | ICD-10-CM

## 2017-06-29 ENCOUNTER — Telehealth: Payer: Self-pay | Admitting: *Deleted

## 2017-06-29 NOTE — Telephone Encounter (Signed)
Sara Osborne is requesting refill of fluconazole 200mg  x 10 days. This is not listed as an active medication. Please advise. Andree CossHowell, Michelle M, RN

## 2017-07-04 NOTE — Telephone Encounter (Signed)
Sorry about the delay - do you know what she mentioned she wanted it for?   Last time her and I spoke she was worried about possible STI's because her ex-fiance was cheating on her. If she has vaginal symptoms I would recommend her to see if she can get a wet prep done with her new provider as I worry about the possibility of treating the wrong thing with fluconazole.   Thank you Marcelino DusterMichelle.

## 2017-07-09 ENCOUNTER — Encounter: Payer: Self-pay | Admitting: *Deleted

## 2017-07-16 ENCOUNTER — Other Ambulatory Visit: Payer: Self-pay | Admitting: Infectious Diseases

## 2017-07-16 DIAGNOSIS — I10 Essential (primary) hypertension: Secondary | ICD-10-CM

## 2017-07-16 DIAGNOSIS — B20 Human immunodeficiency virus [HIV] disease: Secondary | ICD-10-CM

## 2017-07-17 ENCOUNTER — Other Ambulatory Visit: Payer: Self-pay | Admitting: Infectious Diseases

## 2017-07-17 DIAGNOSIS — I10 Essential (primary) hypertension: Secondary | ICD-10-CM

## 2017-07-18 ENCOUNTER — Encounter: Payer: Self-pay | Admitting: Infectious Diseases

## 2017-07-19 ENCOUNTER — Other Ambulatory Visit: Payer: Self-pay | Admitting: *Deleted

## 2017-07-19 MED ORDER — VALACYCLOVIR HCL 1 G PO TABS: 1000.0000 mg | ORAL_TABLET | Freq: Three times a day (TID) | ORAL | 0 refills | Status: AC

## 2017-07-19 NOTE — Telephone Encounter (Signed)
Medication sent to the patient pharmacy as dictated by Judeth CornfieldStephanie NP 1 gram Valtrex 3 times daily for 10 days #30 no refills. Also reminded to get in care as soon as possible. She advised she has an appointment in June so she should be ok.

## 2017-09-10 ENCOUNTER — Other Ambulatory Visit: Payer: Self-pay | Admitting: Infectious Diseases

## 2017-09-10 DIAGNOSIS — B20 Human immunodeficiency virus [HIV] disease: Secondary | ICD-10-CM

## 2017-09-10 DIAGNOSIS — I10 Essential (primary) hypertension: Secondary | ICD-10-CM

## 2017-09-10 DIAGNOSIS — Z21 Asymptomatic human immunodeficiency virus [HIV] infection status: Secondary | ICD-10-CM

## 2019-07-01 IMAGING — CT CT HEAD W/O CM
3 series · 16 of 47 positions shown, 19 images · non-contrast
Comparison: None.

CLINICAL DATA: Headache beginning this morning. Cold symptoms.
Resolved RIGHT-sided numbness.

EXAM:
CT HEAD WITHOUT CONTRAST
TECHNIQUE: Contiguous axial images were obtained from the base of the skull
through the vertex without intravenous contrast.

[Series 3: head 5.0 h30s · axial · 0.43mm/px · z∈[-207,-77]mm · 10 of 32 slices shown, 13 images]
[im 3/32  brain]
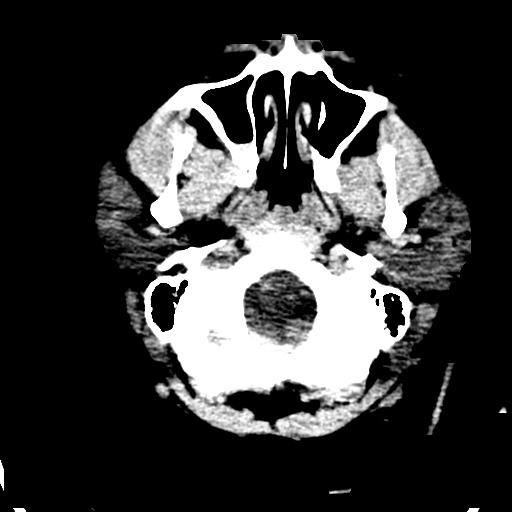
[im 3/32  bone]
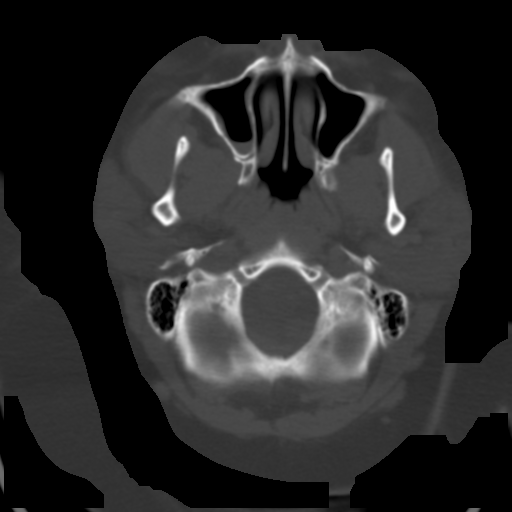
[im 6/32  brain]
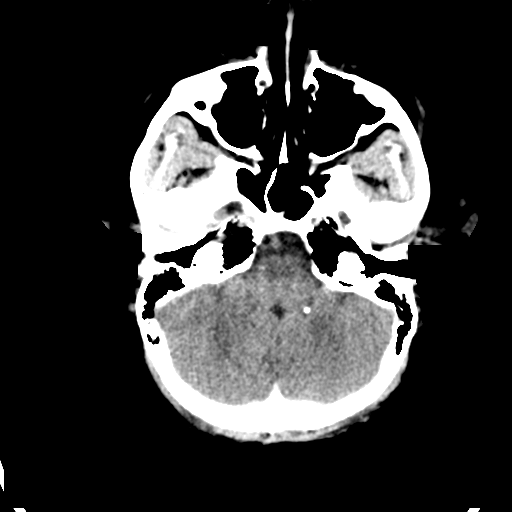
[im 9/32  brain]
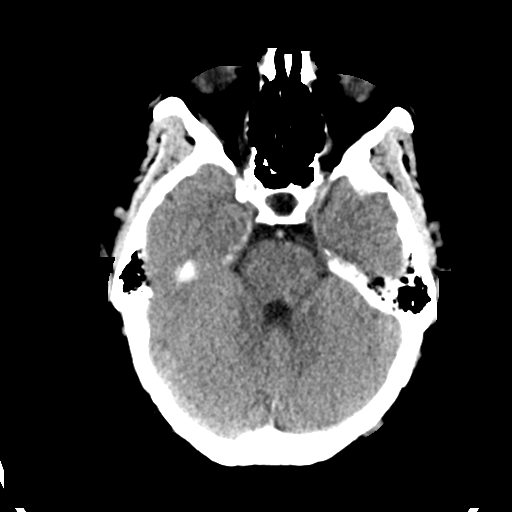
[im 11/32  brain]
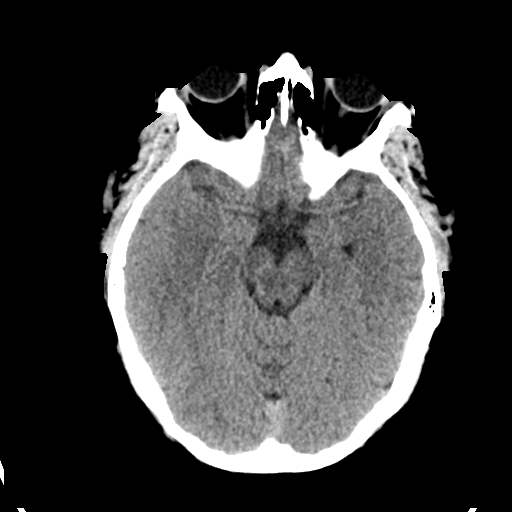
[im 14/32  brain]
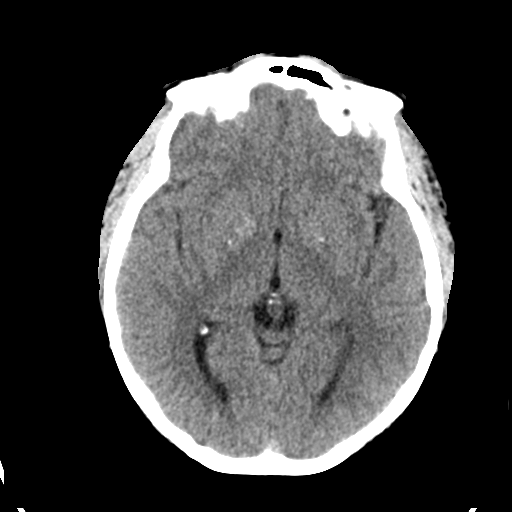
[im 14/32  bone]
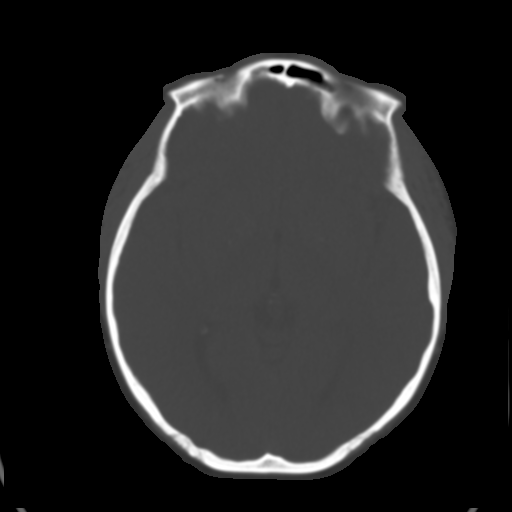
[im 18/32  brain]
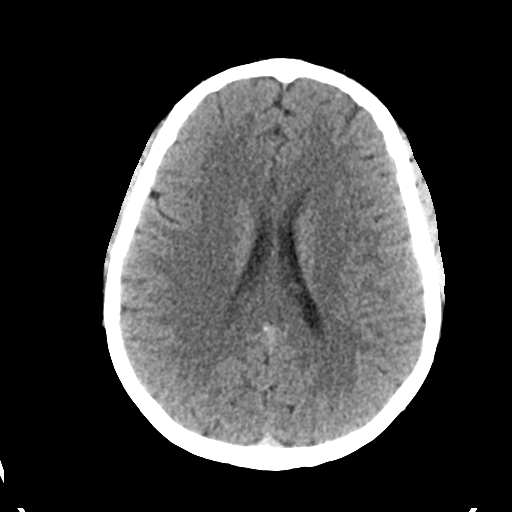
[im 21/32  brain]
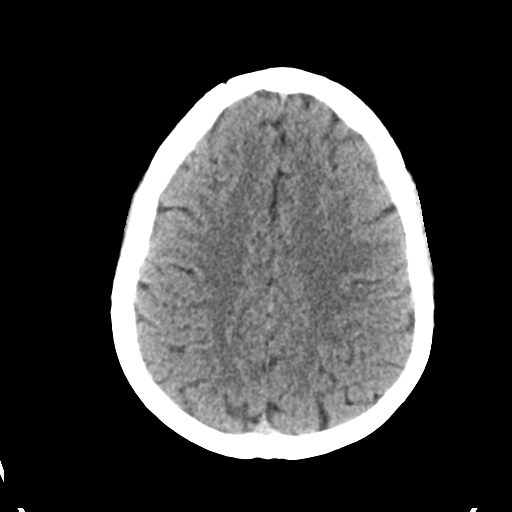
[im 24/32  brain]
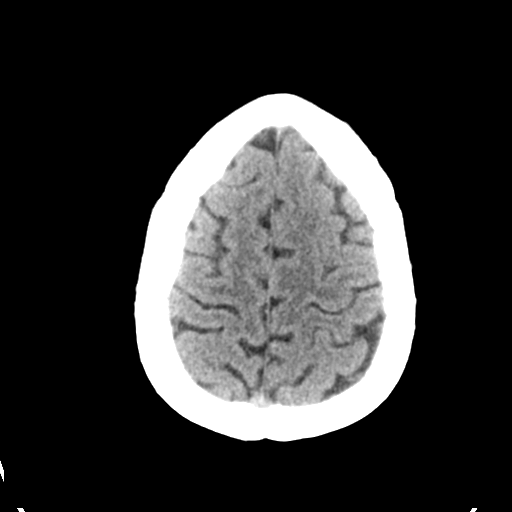
[im 26/32  brain]
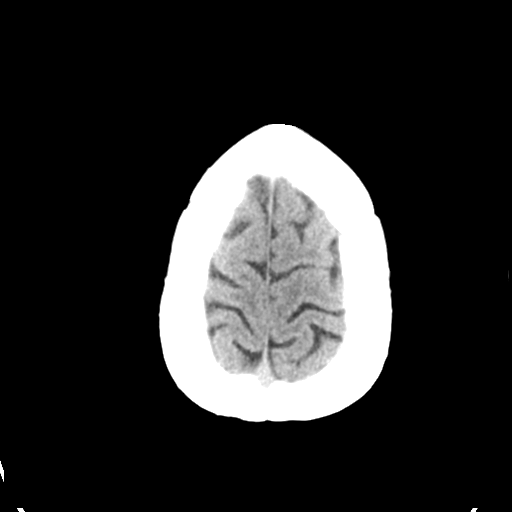
[im 26/32  bone]
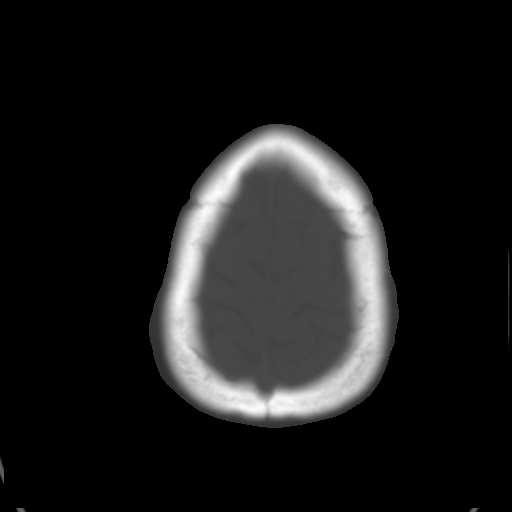
[im 29/32  brain]
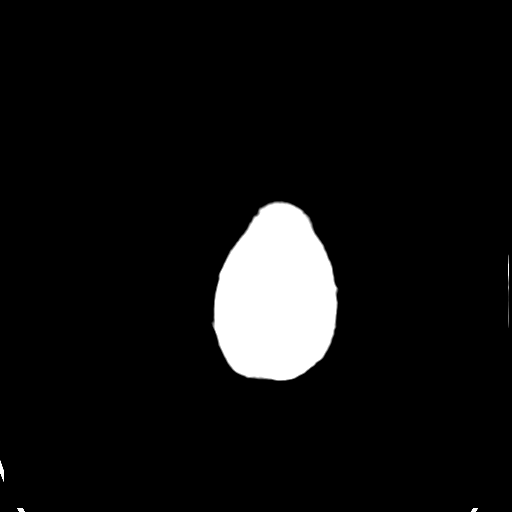

[Series 5: head 3.0 mpr cor · coronal · 0.38mm/px · 3 of 67 slices shown]
[im 23/67  brain]
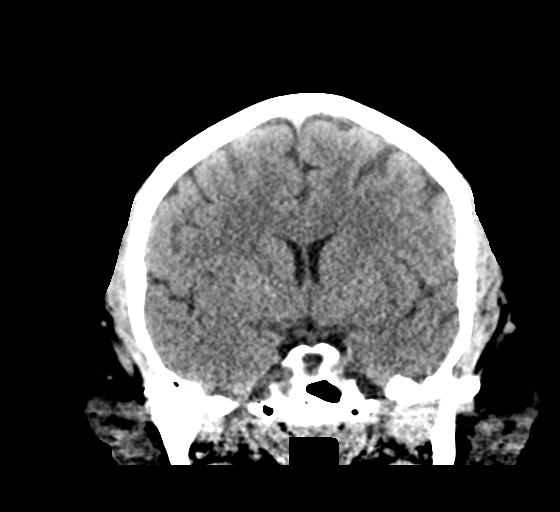
[im 30/67  brain]
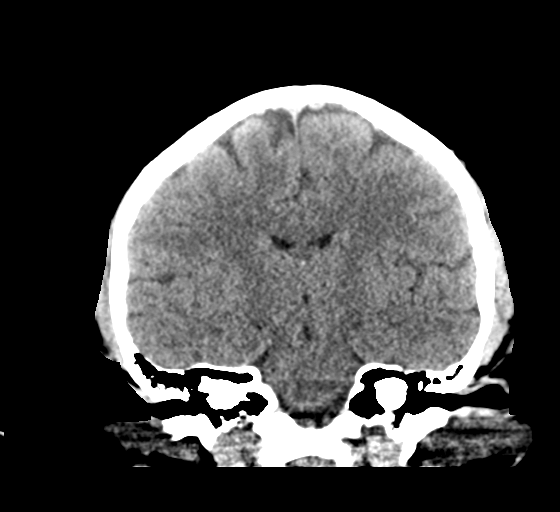
[im 37/67  brain]
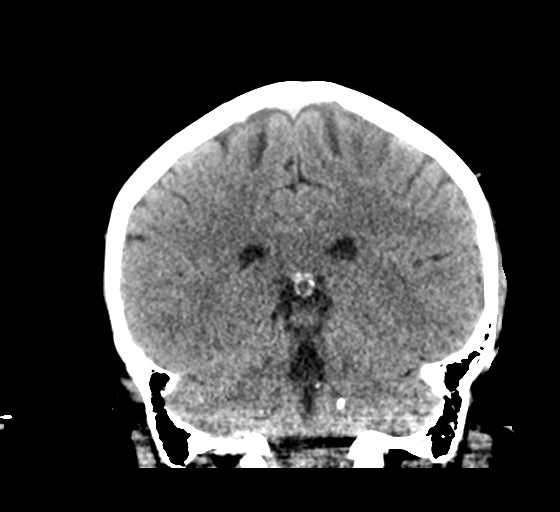

[Series 6: head 3.0 mpr sag · sagittal · 0.35mm/px · 3 of 67 slices shown]
[im 23/67  brain]
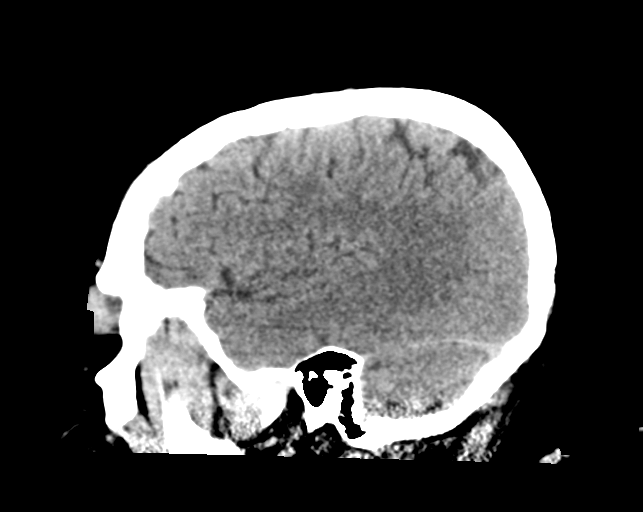
[im 34/67  brain]
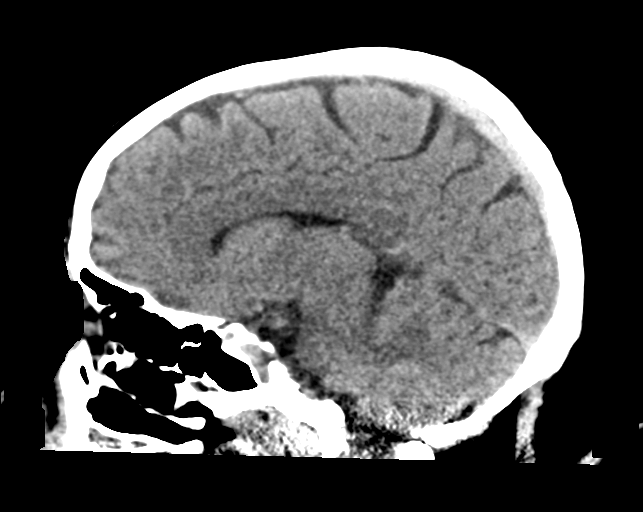
[im 45/67  brain]
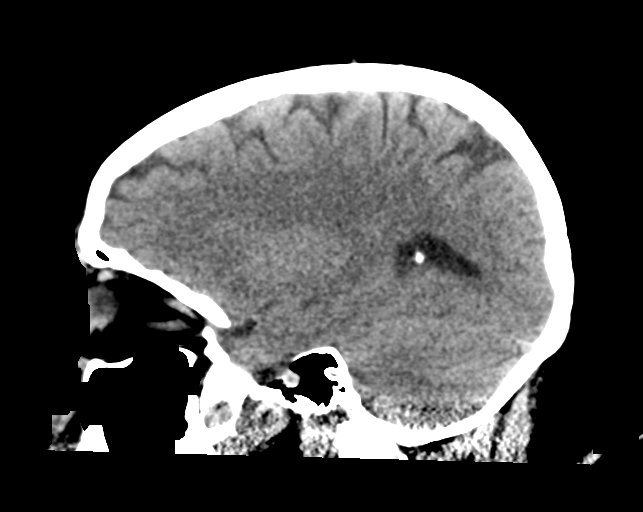

[16 of 47 positions shown; findings below may reference images not displayed]

FINDINGS: BRAIN: No intraparenchymal hemorrhage, mass effect nor midline
shift. The ventricles and sulci are normal. No acute large vascular
territory infarcts. No abnormal extra-axial fluid collections. Basal
cisterns are patent.

VASCULAR: Unremarkable.

SKULL/SOFT TISSUES: No skull fracture. No significant soft tissue
swelling. Partially empty sella.

ORBITS/SINUSES: The included ocular globes and orbital contents are
normal. Pneumatized petrous apices. Mild maxillary sinus mucosal
thickening with LEFT maxillary oral antral fistula seen with dental
disease, incompletely assessed.

OTHER: None.
IMPRESSION: 1. No acute intracranial process.
2. Partially empty sella, otherwise negative noncontrast CT HEAD.

## 2020-01-12 NOTE — Progress Notes (Signed)
Patient ID: Sara Osborne, female   DOB: 1980/07/28, 39 y.o.   MRN: [id number] Received call from [facility] with health dept to advise patient has moved to [facility], [place]
